# Patient Record
Sex: Female | Born: 1975 | Race: White | Hispanic: No | Marital: Single | Smoking: Current every day smoker
Health system: Southern US, Community
[De-identification: ages and names within clinical notes are randomized; demographics above are authoritative.]

## PROBLEM LIST (undated history)

## (undated) DIAGNOSIS — K219 Gastro-esophageal reflux disease without esophagitis: Secondary | ICD-10-CM

## (undated) DIAGNOSIS — Z9889 Other specified postprocedural states: Secondary | ICD-10-CM

## (undated) DIAGNOSIS — N83209 Unspecified ovarian cyst, unspecified side: Secondary | ICD-10-CM

## (undated) DIAGNOSIS — R112 Nausea with vomiting, unspecified: Secondary | ICD-10-CM

## (undated) HISTORY — PX: CHOLECYSTECTOMY: SHX55

## (undated) HISTORY — PX: KNEE SURGERY: SHX244

---

## 1996-09-02 HISTORY — PX: OVARY SURGERY: SHX727

## 1997-09-02 DIAGNOSIS — N83209 Unspecified ovarian cyst, unspecified side: Secondary | ICD-10-CM

## 1997-09-02 HISTORY — DX: Unspecified ovarian cyst, unspecified side: N83.209

## 2005-12-27 ENCOUNTER — Emergency Department: Payer: Self-pay | Admitting: Emergency Medicine

## 2007-05-29 ENCOUNTER — Emergency Department: Payer: Self-pay | Admitting: Emergency Medicine

## 2007-12-30 ENCOUNTER — Emergency Department: Payer: Self-pay | Admitting: Unknown Physician Specialty

## 2009-12-23 ENCOUNTER — Emergency Department: Payer: Self-pay | Admitting: Emergency Medicine

## 2010-09-02 HISTORY — PX: KNEE SURGERY: SHX244

## 2010-09-27 ENCOUNTER — Emergency Department: Payer: Self-pay | Admitting: Emergency Medicine

## 2010-12-14 ENCOUNTER — Ambulatory Visit: Payer: Self-pay | Admitting: Obstetrics and Gynecology

## 2010-12-18 ENCOUNTER — Inpatient Hospital Stay: Payer: Self-pay | Admitting: Obstetrics and Gynecology

## 2011-01-06 ENCOUNTER — Emergency Department: Payer: Self-pay | Admitting: Emergency Medicine

## 2011-05-27 ENCOUNTER — Emergency Department: Payer: Self-pay | Admitting: Emergency Medicine

## 2011-07-23 ENCOUNTER — Ambulatory Visit: Payer: Self-pay | Admitting: Orthopedic Surgery

## 2011-08-06 ENCOUNTER — Ambulatory Visit: Payer: Self-pay | Admitting: Orthopedic Surgery

## 2011-08-23 ENCOUNTER — Ambulatory Visit: Payer: Self-pay | Admitting: Orthopedic Surgery

## 2011-09-18 ENCOUNTER — Other Ambulatory Visit: Payer: Self-pay | Admitting: Orthopedic Surgery

## 2011-09-22 LAB — WOUND CULTURE

## 2011-09-27 ENCOUNTER — Encounter: Payer: Self-pay | Admitting: Cardiothoracic Surgery

## 2011-09-27 ENCOUNTER — Ambulatory Visit: Payer: Self-pay | Admitting: Orthopedic Surgery

## 2011-09-27 ENCOUNTER — Encounter: Payer: Self-pay | Admitting: Nurse Practitioner

## 2011-10-04 ENCOUNTER — Encounter: Payer: Self-pay | Admitting: Nurse Practitioner

## 2011-10-04 ENCOUNTER — Encounter: Payer: Self-pay | Admitting: Cardiothoracic Surgery

## 2011-10-24 LAB — WOUND CULTURE

## 2011-10-25 ENCOUNTER — Ambulatory Visit: Payer: Self-pay | Admitting: Orthopedic Surgery

## 2011-10-25 LAB — PROTIME-INR: Prothrombin Time: 12.2 secs (ref 11.5–14.7)

## 2011-10-25 LAB — BASIC METABOLIC PANEL
Anion Gap: 10 (ref 7–16)
Chloride: 105 mmol/L (ref 98–107)
Co2: 27 mmol/L (ref 21–32)
Creatinine: 0.78 mg/dL (ref 0.60–1.30)
EGFR (Non-African Amer.): >60
Osmolality: 281 (ref 275–301)
Potassium: 4.2 mmol/L (ref 3.5–5.1)

## 2011-10-25 LAB — CBC
HCT: 39.5 % (ref 35.0–47.0)
HGB: 12.7 g/dL (ref 12.0–16.0)
Platelet: 251 10*3/uL (ref 150–440)
RDW: 15.4 % — ABNORMAL HIGH (ref 11.5–14.5)

## 2011-10-25 LAB — APTT: Activated PTT: 26.2 secs (ref 23.6–35.9)

## 2011-10-28 ENCOUNTER — Ambulatory Visit: Payer: Self-pay | Admitting: Orthopedic Surgery

## 2011-11-01 ENCOUNTER — Encounter: Payer: Self-pay | Admitting: Cardiothoracic Surgery

## 2011-11-01 ENCOUNTER — Encounter: Payer: Self-pay | Admitting: Nurse Practitioner

## 2011-11-01 LAB — WOUND CULTURE

## 2011-12-02 ENCOUNTER — Encounter: Payer: Self-pay | Admitting: Cardiothoracic Surgery

## 2011-12-02 ENCOUNTER — Encounter: Payer: Self-pay | Admitting: Nurse Practitioner

## 2012-01-01 ENCOUNTER — Encounter: Payer: Self-pay | Admitting: Cardiothoracic Surgery

## 2012-01-01 ENCOUNTER — Encounter: Payer: Self-pay | Admitting: Nurse Practitioner

## 2012-01-16 ENCOUNTER — Emergency Department: Payer: Self-pay | Admitting: Emergency Medicine

## 2012-02-01 ENCOUNTER — Encounter: Payer: Self-pay | Admitting: Nurse Practitioner

## 2012-02-01 ENCOUNTER — Encounter: Payer: Self-pay | Admitting: Cardiothoracic Surgery

## 2012-03-02 ENCOUNTER — Encounter: Payer: Self-pay | Admitting: Nurse Practitioner

## 2012-03-02 ENCOUNTER — Encounter: Payer: Self-pay | Admitting: Cardiothoracic Surgery

## 2012-04-02 ENCOUNTER — Encounter: Payer: Self-pay | Admitting: Nurse Practitioner

## 2012-04-02 ENCOUNTER — Encounter: Payer: Self-pay | Admitting: Cardiothoracic Surgery

## 2012-09-06 ENCOUNTER — Emergency Department: Payer: Self-pay | Admitting: Emergency Medicine

## 2012-09-06 LAB — BASIC METABOLIC PANEL
Anion Gap: 7 (ref 7–16)
BUN: 10 mg/dL (ref 7–18)
Calcium, Total: 8.7 mg/dL (ref 8.5–10.1)
Creatinine: 0.86 mg/dL (ref 0.60–1.30)
Glucose: 92 mg/dL (ref 65–99)
Osmolality: 274 (ref 275–301)
Potassium: 4.2 mmol/L (ref 3.5–5.1)
Sodium: 138 mmol/L (ref 136–145)

## 2012-09-06 LAB — CBC
HCT: 41.4 % (ref 35.0–47.0)
HGB: 13.4 g/dL (ref 12.0–16.0)
MCH: 24.8 pg — ABNORMAL LOW (ref 26.0–34.0)
MCHC: 32.4 g/dL (ref 32.0–36.0)
Platelet: 320 10*3/uL (ref 150–440)
RBC: 5.41 10*6/uL — ABNORMAL HIGH (ref 3.80–5.20)
RDW: 18 % — ABNORMAL HIGH (ref 11.5–14.5)
WBC: 12.9 10*3/uL — ABNORMAL HIGH (ref 3.6–11.0)

## 2012-09-06 LAB — URINALYSIS, COMPLETE
Blood: NEGATIVE
Ketone: NEGATIVE
Nitrite: NEGATIVE
Protein: NEGATIVE
RBC,UR: 1 /HPF (ref 0–5)
Specific Gravity: 1.006 (ref 1.003–1.030)
Squamous Epithelial: 4
WBC UR: 2 /HPF (ref 0–5)

## 2012-10-30 IMAGING — CT CT TIBIA/FIBULA*L* WO/W CM
2 series · 10 of 14 positions shown, 12 images · non-contrast
Comparison: none

REASON FOR EXAM: pain and swelling  eval post op infection seroma  post
op knee surg
COMMENTS:

[Series 8: soft · axial · 0.55mm/px · z∈[-468,-192]mm · 5 of 138 slices shown, 7 images (1 of 2)]
[im 23/138  soft-tissue]
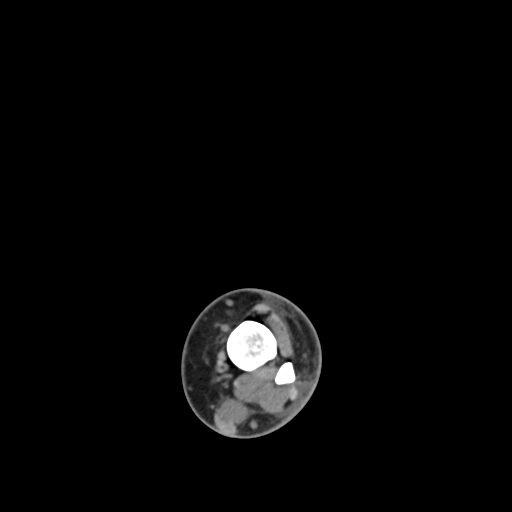
[im 23/138  bone]
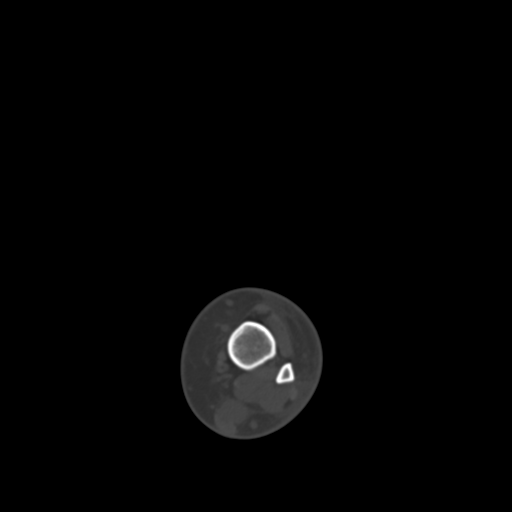
[im 46/138  bone]
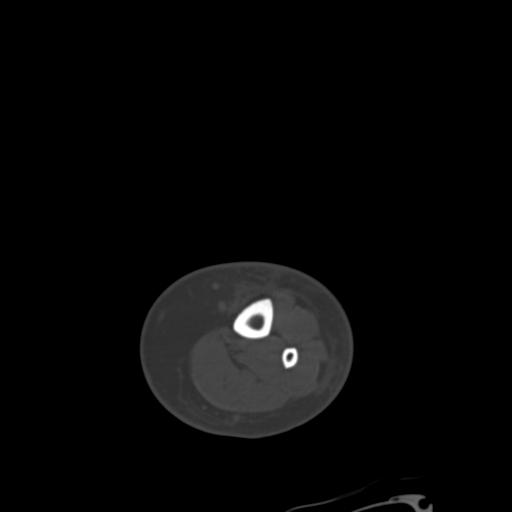
[im 69/138  bone]
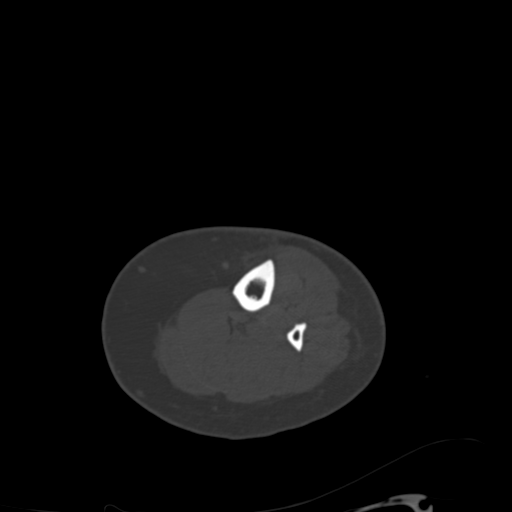
[im 92/138  bone]
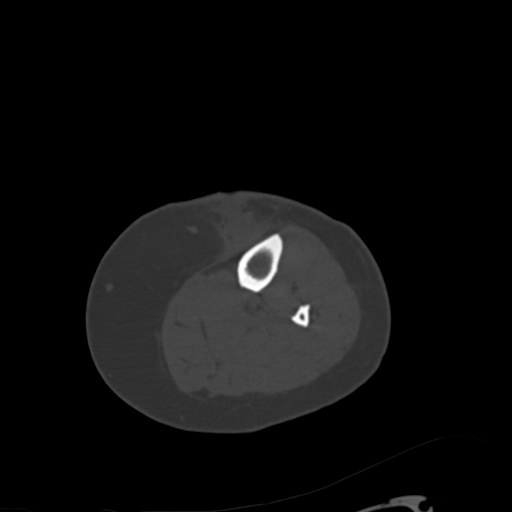
[im 115/138  soft-tissue]
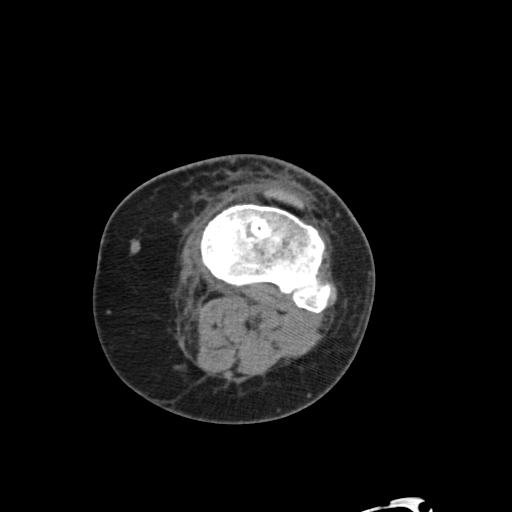
[im 115/138  bone]
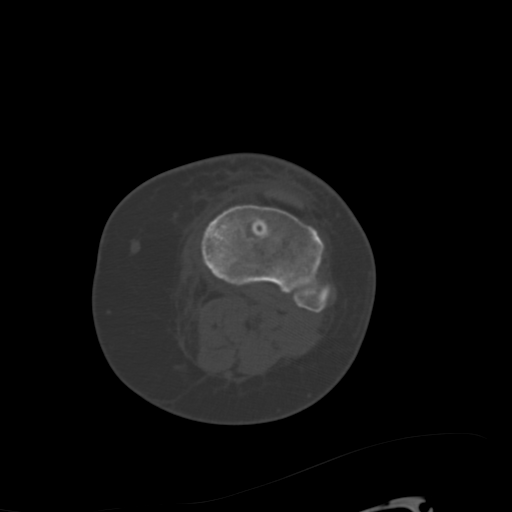

[Series 9: soft · axial · 0.55mm/px · z∈[-468,-192]mm · 5 of 138 slices shown (2 of 2)]
[im 23/138  soft-tissue]
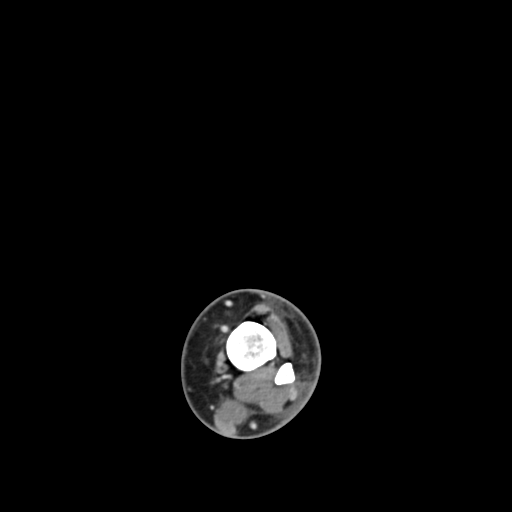
[im 46/138  soft-tissue]
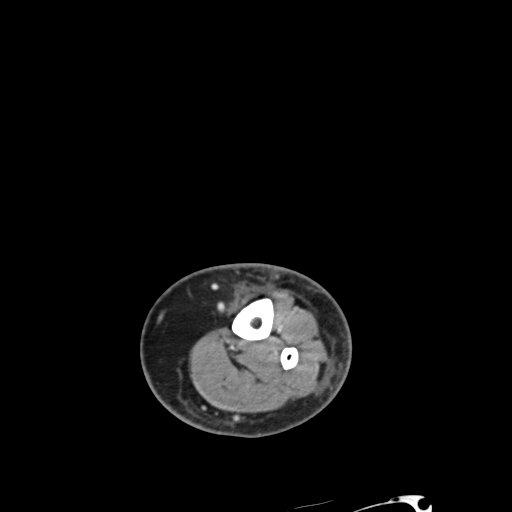
[im 69/138  soft-tissue]
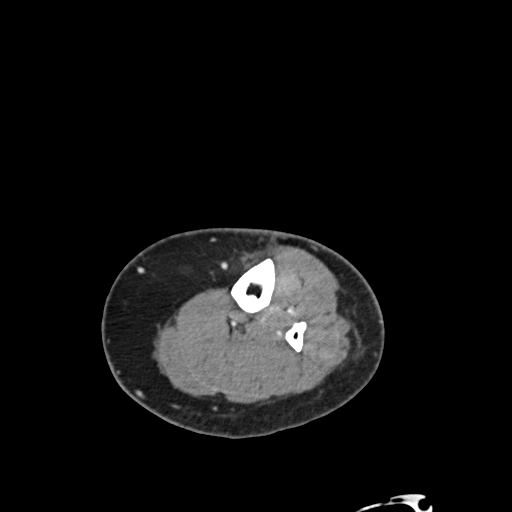
[im 92/138  soft-tissue]
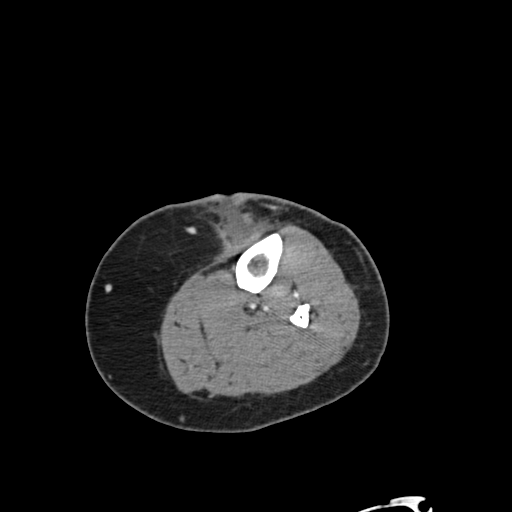
[im 115/138  soft-tissue]
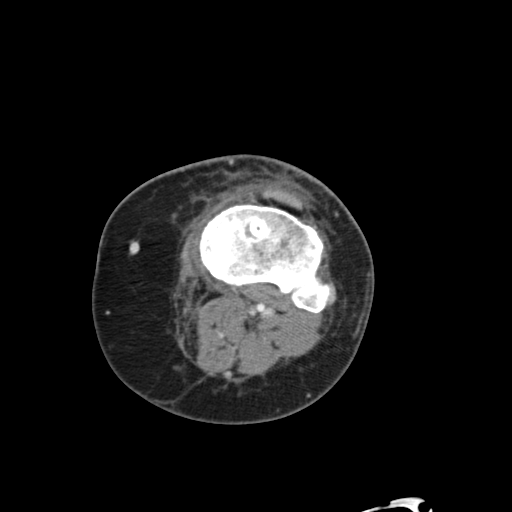

[10 of 14 positions shown; findings below may reference images not displayed]

PROCEDURE:     CT  - CT TIBIA / LOWER LEG LEFT W/WO  - September 27, 2011  [DATE]

RESULT:     Axial imaging was performed through the left leg from the knee
to the ankle. Coronal and sagittal reconstructions were obtained. Soft
tissue and bone windows were photographed. The patient received 100 cc of
Usovue-PJ3 for the postcontrast images.

Mineralization of the tibia and fibula is normal. There is no lytic or
blastic lesion or periosteal reaction.

There is a metallic staple from previous ACL repair present in the
metadiaphysis of the proximal tibia. The staple lies along the superior
aspect of an abnormal fluid collection in the pretibial soft tissues. This
fluid collection measures 5.6 cm in length by 2.3 cm AP by 1.9 cm
transversely. I see no rim calcification. There is minimal enhancement of
the soft tissues surrounding the fluid collection. The fluid collection
extends to the skin and its margins are irregular.
IMPRESSION: There is an abnormal fluid collection in the upper
pretibial soft tissues. This extends from the anterior surface of the
proximal tibia to the skin surface. This fluid collection may reflect a
seroma or abcess. Aspiration of the fluid collection laboratory analysis and
culture would be a useful next step. I do not see radiographic evidence of
acute osteomyelitis. Followup imaging techniques including white blood cell
scanning or 3 phase technetium bone scanning with SPECT could be considered.

## 2014-08-21 ENCOUNTER — Emergency Department: Payer: Self-pay | Admitting: Emergency Medicine

## 2014-08-23 LAB — BETA STREP CULTURE(ARMC)

## 2014-12-25 NOTE — Op Note (Signed)
PATIENT NAME:  Diamond Mcclain, Diamond Mcclain MR#:  [id number] DATE OF BIRTH:  May 01, 1976  DATE OF PROCEDURE:  08/06/2011  PREOPERATIVE DIAGNOSIS: Left knee anterior cruciate ligament tear and medial meniscal tear.   POSTOPERATIVE DIAGNOSIS: Left knee anterior cruciate ligament tear, displaced bucket handle tear of the medial meniscus, and radial tear of the lateral meniscus.   PROCEDURE: Left knee arthroscopy with partial medial and lateral meniscectomies and anterior cruciate ligament reconstruction with hamstring autograft.   SURGEON: [place], M.D.   ANESTHESIA: General.   COMPLICATIONS: None.   SPECIMENS: None.   INDICATIONS FOR PROCEDURE: The patient is a 39 year old female who injured her left knee after she stepped into a hole at work. She has had limited knee range of motion following this injury and episodes of instability. An MRI has revealed findings consistent with an anterior cruciate ligament tear and medial meniscal tear.   I reviewed the risks and benefits of surgery with the patient. She wished to proceed with surgery based on the MRI findings and her severely limited range of motion. She understands the risks of surgery include infection, bleeding, nerve or blood vessel injury, knee stiffness, persistent pain or instability, the need for an allograft hamstring, need for further surgery, the development of osteoarthritis, persistent pain or instability, deep venous thrombosis and pulmonary embolism, myocardial infarction, stroke, respiratory failure, pneumonia, and death. The patient understood these risks and signed the consent form in my office prior to the date of surgery. She had ample time to ask any questions regarding surgery, postoperative course, and consent.   PROCEDURE NOTE: The patient was met in the preoperative area. She had her history and physical updated. There were no changes to her health. The patient's left knee was signed with the word yes within the operative field  according to the hospital's right site protocol. The patient was then brought to the operating room where she was placed supine on the operative table. All bony prominences were adequately padded. She underwent general endotracheal intubation.   A time-out was performed to verify the patient's name, date of birth, medical record number, correct site of surgery, and correct procedure to be performed. It was also used to verify the patient had received antibiotics and that all appropriate instruments, implants, and radiographic studies were available in the room. Once all in attendance were in agreement, the case began.   Examination under anesthesia revealed that the patient could fully extend and flex to 120 degrees. She did have anterior laxity on Lachman test with anterior translation of 10 mm. She had a positive pivot shift test as well. There was no instability with varus or valgus stress testing at zero and 30 degrees of flexion.   The patient was prepped and draped in a sterile fashion. Proposed incisions were drawn out based upon bony landmarks with a surgical marker and pre-injected with 1% lidocaine plain. A #11 blade was used to establish inferior medial and lateral portals. The inferior medial portal was performed under direct visualization. The arthroscope was placed through the inferolateral portal and a full diagnostic examination of the knee was performed.    Findings on arthroscopy included a large bucket handle tear of the medial meniscus with displacement into the posterior knee, a torn anterior cruciate ligament from its femoral attachment as well as a tear to the mid body of the lateral meniscus which included a radial component as well as meniscal flap.   The meniscal tears were addressed first. The medial meniscal tear was  reduced into the knee joint with a nerve hook probe. There was maceration of the meniscal tear and it was within the white-white zone and deemed unrepairable. This  was removed from its attachment using an arthroscopic scissors and the bucket portion of the meniscus was removed from the knee in a single piece. The remaining meniscal rim was probed to ensure stability and a 4-0 resector shaver blade was used to resect any unstable portions of the remaining rim. The patient's anterior cruciate ligament was then resected using a 4-0 resector shaver blade and 90-degree ArthroCare wand. The patient's knee was then placed in a figure four position and the lateral meniscus tear was resected using a straight basket and 4-0 resector shaver blade. A stable remaining rim was ensured using a nerve hook probe. All arthroscopic instruments were then removed.   An anteromedial incision was made based over the proximal tibia. This incision was made with a #15 blade. All bleeding vessels were cauterized during exposure. The underlying soft tissue was incised using a 4-0 resector shaver blade. The hamstring tendons were then palpable. A #15 blade was used to make an L-shaped incision along the superior aspect of the hamstrings and then a vertical component was used to detach the hamstrings from their insertion point on the proximal tibia. Sartorius fascia was reflected to reveal the underlying gracilis and semitendinosus. These were removed from the knee using a tendon stripper. The two grafts were then prepared on the back table. The tendon lengths were cut at 220 mm in length. The aggregate of the four-stranded graft after preparation on the back table was 8 mm on the femoral side and 8.5 on the tibial side. The graft was whip-stitched using an ArthroCare fiber loop. The graft was then placed on the Graft Master under 15-mm of tension, and a moist Ray-Tec was placed over the four-stranded graft to prevent it from desiccation.   Attention was then turned back to the knee. The arthroscope was placed into the inferomedial portal. The femoral drill guide was then placed at the femoral  attachment site in the knee. A small lateral incision was made over the femur to allow advancement of the drill guide alongside the lateral femur. A small longitudinal incision was placed into the IT band to allow the drill guide to reach the lateral femoral cortex. This drill guide was then gently malleted into place to hold its position along the lateral cortex. An 8-mm flip cutter was then drilled across the femur and into the intercondylar notch. The interosseous distance was measured to be about 35 to 38 mm. The flip cutter was then engaged and approximately a 30-mm tunnel was created in a retrograde fashion. The flip cutter drill guide was then removed and an ArthroCare fiber stick suture was placed through the femoral tunnel and brought out the inferior lateral portal. Both ends of this suture were snapped to prevent the suture from falling out.   Attention was then given to creating the tibial tunnel.  A retro-cutting tunnel drill guide was placed at the footprint of the anterior cruciate ligament. The flip cutter drill guide was then advanced through the anterior cortex of the tibia obliquely until it reached the inside of the knee and engaged the retro-cutting drill bit.  This was brought out again through a retrograde fashion. The loop was then retrieved from inside the knee and brought out the tibial tunnel to allow for shuttling of the hamstring graft. An anterior cruciate ligament tightrope RT was  then attached to the four-stranded hamstring graft. This was shuttled through the knee using the fiber stick suture that had been placed previously. The four strands of the graft were brought out the lateral femoral incision. The button was then brought out the lateral cortex. Its position was confirmed on FluoroScan imaging to be flush with the lateral cortex. The four-stranded hamstring graft was then advanced by tensioning the strands of the suture of the anterior cruciate ligament tightrope RT. This was  bottomed out. The 30-mm mark placed on the anterior cruciate ligament graft was seen to be at the base of the femoral tunnel. The graft was then cycled to remove creep from the graft. The graft was then placed in 30 degrees of flexion. A Nitinol wire was placed through the tibial tunnel to allow guidance of the Bio Composite screw.  Counter traction was placed on the four-stranded hamstring graft while a 9 x 28 Arthrex Bio Composite screw was inserted. During insertion, a posterior drawer force was applied by the surgical tech.   Once the Bio Composite screw was in place, an 11-mm Richards staple was placed over the ends of the four-stranded hamstring graft as back-up fixation. The patient's range of motion was from full extension to 130 degrees of flexion on the table. Lachman test showed no anterior laxity. Final arthroscopic images of the graft were performed. With the knee in full extension there was no impingement on the intercondylar notch. There was excellent tension on the graft. A 4-0 resector shaver blade was used to irrigate the knee and remove any chondral or osseous debris. All arthroscopic instruments were then removed. 4-0 nylon sutures were used to close the arthroscopic portals times two. 2-0 Vicryl was used to close the subcutaneous tissues of the lateral femoral incision and the anterior medial proximal tibial incision. 4-0 nylons were used to approximate the skin of the femoral incision but a running 3-0 Monocryl was used to close the skin over the anterior medial proximal tibial incision. Dry sterile dressings were applied. A TENS unit was also applied to the left knee along with a Polar Care sleeve. A Breg T-scope brace was placed on the left knee in full extension. I was scrubbed and present for the entire case and all sharp and instrument counts were correct at the conclusion of the case.  I spoke with the patient's friend who accompanied her to surgery in the postoperative consultation  room to let her know the patient was stable in the recovery room and the case had gone without complication.     ____________________________ Timoteo Gaul, MD klk:bjt D: 08/13/2011 12:27:01 ET T: 08/13/2011 13:08:40 ET JOB#: [id number]  cc: Timoteo Gaul, MD, <Dictator> Timoteo Gaul MD ELECTRONICALLY SIGNED 09/09/2011 13:35

## 2014-12-25 NOTE — Discharge Summary (Signed)
PATIENT NAME:  Diamond Mcclain, Diamond Mcclain MR#:  [id number] DATE OF BIRTH:  Jul 19, 1976  DATE OF ADMISSION:  10/28/2011 DATE OF DISCHARGE:  10/31/2011  ADMITTING DIAGNOSIS: Left tibia wound irrigation and debridement.   HISTORY OF PRESENT ILLNESS: Diamond Mcclain is a 39 year old female who had undergone a left anterior cruciate ligament reconstruction on 08/06/2011. A month postop the patient developed persistent drainage from the inferior pole of the incision. She was then admitted postoperatively after her second irrigation and debridement for this persistently draining wound.   HOSPITAL COURSE: The patient was admitted on 10/28/2011 postoperatively and brought to the orthopedic floor. A Wound VAC was ordered for the patient which was delivered on 10/29/2011. This was applied on the floor. The VAC dressing had been placed intraoperatively. The home unit was delivered on postop day #2. The patient continued to have significant left leg pain through the second postoperative day. She received physical therapy while inpatient to work on lower extremity exercises and assistance with ambulation with the Wound VAC in place. The patient also was on Unasyn while an inpatient to treat Enterococcus which had been seen on previous cultures. The cultures throughout her hospitalization taken from the operating room were negative for bacterial growth. Given the patient's clinical improvement, she was prepared for discharge on postop day #3. She was instructed to continue to elevate her left lower extremity whenever possible. She will have home health for Wound VAC dressing changes. She will also continue working on home exercise program and will follow-up in my office in 7 to 10 days for re-evaluation.   ____________________________ Kathreen DevoidKevin L. Dayton Kenley, MD klk:drc D: 11/19/2011 11:12:11 ET T: 11/19/2011 11:21:48 ET JOB#: [id number]  cc: Kathreen DevoidKevin L. Tyvon Eggenberger, MD, <Dictator> Kathreen DevoidKEVIN L Annabelle Rexroad MD ELECTRONICALLY SIGNED 11/22/2011 9:19

## 2014-12-25 NOTE — Op Note (Signed)
PATIENT NAME:  Diamond Mcclain, Diamond Mcclain MR#:  [id number] DATE OF BIRTH:  02/19/1976  DATE OF PROCEDURE:  10/28/2011  PREOPERATIVE DIAGNOSIS: Left leg persistent wound drainage.   POSTOPERATIVE DIAGNOSIS: Left leg persistent wound drainage.  PROCEDURE: Open irrigation and debridement of left proximal tibial wound.   SURGEON: Juanell FairlyKevin Shaletha Humble, MD   ANESTHESIA: General.   COMPLICATIONS: None.   SPECIMENS: Two culture swabs to microbiology as well as tissue to microbiology for assessment for infection.   INDICATION FOR THE PROCEDURE: Ms. Diamond Mcclain is a 39 year old female who underwent a left knee anterior cruciate ligament reconstruction on 08/06/2011 for an injury at work. She has had persistent drainage from anterior tibial incision postoperatively. She underwent one previous irrigation and debridement but had persistent drainage despite treatment in the Wound Center. Given its failure to close and persistent drainage, I felt it was best to repeat the irrigation and debridement. There has been no clinical indication of a septic knee joint on exam. The patient is  showing no signs of sepsis.   PROCEDURE NOTE: The patient signed the consent form. She understands the risks of the procedure included infection, persistent drainage, and the need for further surgery as well as medical complications such as deep venous thrombosis and pulmonary embolism, myocardial infarction, stroke, pneumonia, respiratory failure, and death. The patient was seen in the preop area where her left leg was marked with the word yes according to hospital's right site protocol. Her History and Physical was updated. The patient had ample time to ask questions, as did her friend at the bedside. The patient was then taken to the Operating Room where she was placed supine on the operative table. All bony prominences were adequately padded. Antibiotics were held as cultures were to be taken. The patient was prepped and draped in sterile fashion. A  timeout was performed to verify the patient's name, date of birth, medical record number, correct site of surgery, and correct procedure to be performed. We also verified the patient received antibiotics and that all appropriate instruments, implants, and radiographic studies were available in the room. Once all in attendance were in agreement, the case began.   The patient had an initial culture swab taken of the open portion of the inferior incision. This was sent to the microbiology lab for aerobic and anaerobic culture. A #15 blade was then used to extend the open wound proximally and distally for a total length of approximately 3 to 4 cm. The skin edges and edges of the tissue were carefully resected and excised leading to a new healthy layer of tissue. The excised tissue was sent to the laboratory for evaluation for infection. A deep culture swab was also taken and sent to the laboratory for evaluation. The patient had an area of opening over the medial proximal tibia following into the hiatus for the medial hamstring tendons which were harvested for her original anterior cruciate ligament site. No purulent material was encountered during this procedure. There was no large focal seromas encountered either. The patient had 6 liters of GU-impregnated saline pulse lavage throughout the wounds. The proximal portion of the incision was then closed with 3-0 nylon in interrupted fashion. The inferior portion of the wound measuring approximately 1.5 x 2 cm, which was the original size of the opening wound, was then packed with a VAC dressing sponge. A VAC dressing was then applied to the left leg. The patient was then awoken and brought to the PAC-U in stable condition. I was scrubbed and  present for the entire case, and all sharp and instrument counts were correct at the conclusion of the case.   By the end of the case, the stat Gram stains were back. The superficial swab showed no organisms and moderate white  blood cells. The deep swab showed no organisms and a few white blood cells, and the tissue culture showed no organisms with medium white blood cells. I will continue following these cultures. The patient is being admitted for intravenous antibiotics. The last culture sent from the Wound Center had grown out Enterococcus faecalis sensitive to ampicillin. She is being placed on Unasyn 3 grams every 6 hours per the Pharmacy recommendations. The patient will also have a portable VAC unit ordered for her to take home. She may be discharged home with Home Health after this [place] unit is available to her.  ____________________________ Diamond Devoid, MD klk:cbb D: 10/29/2011 12:21:57 ET T: 10/29/2011 12:39:06 ET JOB#: [id number]  cc: Diamond Devoid, MD, <Dictator> Diamond Devoid MD ELECTRONICALLY SIGNED 10/30/2011 17:27

## 2014-12-25 NOTE — Op Note (Signed)
PATIENT NAME:  Diamond Mcclain, Diamond Mcclain MR#:  [id number] DATE OF BIRTH:  14-Dec-1975  DATE OF PROCEDURE:  08/23/2011  PREOPERATIVE DIAGNOSIS: Left tibial incision with possible wound infection.   POSTOPERATIVE DIAGNOSIS: Persistent drainage left tibial incision, likely hematoma versus superficial wound infection.   PROCEDURE: Irrigation and debridement of left knee tibial incision.   SURGEON: Kathreen DevoidKevin L. Makinsey Pepitone, MD  ANESTHESIA: General.   SPECIMENS: Swabs sent for Gram stain and culture to microbiology.   FINDINGS. No purulent fluid was encountered, serosanguineous fluid only was seen.   ESTIMATED BLOOD LOSS: Minimal.   COMPLICATIONS: None.   INDICATIONS FOR PROCEDURE: Patient is a 39 year old female who underwent a left knee anterior cruciate ligament reconstruction on 08/06/2011. She returned to the office today for her second postoperative visit and had persistent serosanguineous drainage from the inferior pole of the anterior tibial incision despite being on Keflex 500 mg t.i.d. for seven days. The decision was made to wash out the incision for possible wound infection. Patient understood and agreed with this plan. She understands the risks of the procedure include infection, bleeding, bruising, persistent pain or persistent drainage and the need for further surgery. Medical complications will include deep venous thrombosis and pulmonary embolism, myocardial infarction, stroke, pneumonia, respiratory failure, and death. Patient signed the consent form prior to surgery. I marked the word "yes" over the left knee within the operative field according to the hospital's right site protocol.   PROCEDURE NOTE: Patient was taken to the Operating Room where she was placed supine on the operative table and underwent general anesthesia. She was prepped and draped in sterile fashion. A timeout was performed to verify the patient's name, date of birth, medical record number, correct site of surgery, and correct  procedure to be performed. Patient received 1 gram of vancomycin prior to the onset of the case.   A 15 blade was used to open the inferior three-quarters of the original incision. No purulent fluid was encountered. The original suture material was removed as it was encountered. Serous fluid was the only fluid encountered during the case. Patient was pulse irrigated with 6 liters of GU impregnated saline. The wound was then dried. The subcutaneous tissues were closed in two layers using 2-0 PDS. The skin was approximated with a running 3-0 Monocryl and reinforced with 4-0 nylon. Dry sterile and compressive dressing was applied to the left leg. She was then awoken, brought to the PAC-U in stable condition. I was scrubbed and present for the entire case and all sharp and instrument counts were correct at the conclusion of the case. Patient will remain on Keflex 500 mg every six hours. I spoke with Dr. Leavy CellaBlocker, infectious disease specialist, who agreed with treating the patient with Keflex. I will follow the culture and Gram stains.   ____________________________ Kathreen DevoidKevin L. Cashius Grandstaff, MD klk:cms D: 08/23/2011 17:29:20 ET T: 08/23/2011 17:47:59 ET JOB#: [id number]  cc: Kathreen DevoidKevin L. Petrona Wyeth, MD, <Dictator> Kathreen DevoidKEVIN L Graig Hessling MD ELECTRONICALLY SIGNED 09/06/2011 15:15

## 2015-02-04 ENCOUNTER — Encounter: Payer: Self-pay | Admitting: Emergency Medicine

## 2015-02-04 ENCOUNTER — Emergency Department
Admission: EM | Admit: 2015-02-04 | Discharge: 2015-02-04 | Disposition: A | Discharge status: Home / Self Care | Payer: Self-pay | Attending: Emergency Medicine | Admitting: Emergency Medicine

## 2015-02-04 ENCOUNTER — Emergency Department: Payer: Self-pay

## 2015-02-04 DIAGNOSIS — Y9389 Activity, other specified: Secondary | ICD-10-CM | POA: Insufficient documentation

## 2015-02-04 DIAGNOSIS — S40012A Contusion of left shoulder, initial encounter: Secondary | ICD-10-CM | POA: Insufficient documentation

## 2015-02-04 DIAGNOSIS — Y9289 Other specified places as the place of occurrence of the external cause: Secondary | ICD-10-CM | POA: Insufficient documentation

## 2015-02-04 DIAGNOSIS — Y998 Other external cause status: Secondary | ICD-10-CM | POA: Insufficient documentation

## 2015-02-04 DIAGNOSIS — Z720 Tobacco use: Secondary | ICD-10-CM | POA: Insufficient documentation

## 2015-02-04 DIAGNOSIS — W010XXA Fall on same level from slipping, tripping and stumbling without subsequent striking against object, initial encounter: Secondary | ICD-10-CM | POA: Insufficient documentation

## 2015-02-04 MED ORDER — IBUPROFEN 800 MG PO TABS: 800.0000 mg | ORAL_TABLET | Freq: Three times a day (TID) | ORAL | Status: DC | PRN

## 2015-02-04 MED ORDER — CYCLOBENZAPRINE HCL 5 MG PO TABS: 5.0000 mg | ORAL_TABLET | Freq: Three times a day (TID) | ORAL | Status: DC | PRN

## 2015-02-04 NOTE — ED Provider Notes (Signed)
[facility] Emergency Department Provider Note  ____________________________________________  Time seen: Approximately 7:17 AM  I have reviewed the triage vital signs and the nursing notes.   HISTORY  Chief Complaint Shoulder Pain    HPI EADIE REPETTO is a 39 y.o. female presents to the emergency room for evaluation of her left shoulder pain. Patient states that she was at [facility] this morning when she slipped on a pole water landing on her front arms. Complained of increasing and progressively worsening pain to the left shoulder increased pain with extension, lifting and abduction.Neisseria denies any loss of consciousness no other physical complaints other than some bruising noted to both lower legs.   History reviewed. No pertinent past medical history.  There are no active problems to display for this patient.   Past Surgical History  Procedure Laterality Date  . Knee surgery      Current Outpatient Rx  Name  Route  Sig  Dispense  Refill  . cyclobenzaprine (FLEXERIL) 5 MG tablet   Oral   Take 1 tablet (5 mg total) by mouth every 8 (eight) hours as needed for muscle spasms.   30 tablet   0   . ibuprofen (ADVIL,MOTRIN) 800 MG tablet   Oral   Take 1 tablet (800 mg total) by mouth every 8 (eight) hours as needed.   30 tablet   0     Allergies Sulfa antibiotics and Tape  No family history on file.  Social History History  Substance Use Topics  . Smoking status: Current Every Day Smoker -- 0.50 packs/day    Types: Cigarettes  . Smokeless tobacco: Not on file  . Alcohol Use: No    Review of Systems Constitutional: No fever/chills Eyes: No visual changes. ENT: No sore throat. Cardiovascular: Denies chest pain. Respiratory: Denies shortness of breath. Gastrointestinal: No abdominal pain.  No nausea, no vomiting.  No diarrhea.  No constipation. Genitourinary: Negative for dysuria. Musculoskeletal: Negative for back pain. Positive for  left upper shoulder pain. Skin: Negative for rash. Neurological: Negative for headaches, focal weakness or numbness.  10-point ROS otherwise negative.  ____________________________________________   PHYSICAL EXAM:  VITAL SIGNS: ED Triage Vitals  Enc Vitals Group     BP --      Pulse --      Resp --      Temp --      Temp src --      SpO2 --      Weight --      Height --      Head Cir --      Peak Flow --      Pain Score --      Pain Loc --      Pain Edu? --      Excl. in GC? --     Constitutional: Alert and oriented. Well appearing and in no acute distress. Eyes: Conjunctivae are normal. PERRL. EOMI. Head: Atraumatic. Nose: No congestion/rhinnorhea. Mouth/Throat: Mucous membranes are moist.  Oropharynx non-erythematous. Neck: No stridor.   Cardiovascular: Normal rate, regular rhythm. Grossly normal heart sounds.  Good peripheral circulation. Respiratory: Normal respiratory effort.  No retractions. Lungs CTAB. Musculoskeletal: Upper left shoulder with tenderness in abduction and adduction. No evidence of ecchymosis or bruising. Increased pain with lifting overhead. Severe limited range of motion Neurologic:  Normal speech and language. No gross focal neurologic deficits are appreciated. Speech is normal. No gait instability. Skin:  Skin is warm, dry and intact. No rash  noted. Psychiatric: Mood and affect are normal. Speech and behavior are normal.  ____________________________________________   LABS (all labs ordered are listed, but only abnormal results are displayed)  Labs Reviewed - No data to display ____________________________________________  EKG  Deferred ____________________________________________  RADIOLOGY  Read by radiologist, reviewed by myself. Negative ____________________________________________   PROCEDURES  Procedure(s) performed: None  Critical Care performed: No  ____________________________________________   INITIAL  IMPRESSION / ASSESSMENT AND PLAN / ED COURSE  Pertinent labs & imaging results that were available during my care of the patient were reviewed by me and considered in my medical decision making (see chart for details).  She is status post fall this morning landing on her left shoulder. Diagnosis is a left shoulder contusion. Rx given for Motrin and Flexeril. Reassurance provided patient to wear sling as needed for comfort. Return to the ER if symptoms worsen. Patient voices no other emergency medical complaints at this time ____________________________________________   FINAL CLINICAL IMPRESSION(S) / ED DIAGNOSES  Final diagnoses:  Shoulder contusion, left, initial encounter      Evangeline Dakinharles M Glennda Weatherholtz, PA-C 02/04/15 [id number]  Governor Rooksebecca Lord, MD 02/04/15 [phone]

## 2015-02-04 NOTE — Discharge Instructions (Signed)
Contusion °A contusion is a deep bruise. Contusions are the result of an injury that caused bleeding under the skin. The contusion may turn blue, purple, or yellow. Minor injuries will give you a painless contusion, but more severe contusions may stay painful and swollen for a few weeks.  °CAUSES  °A contusion is usually caused by a blow, trauma, or direct force to an area of the body. °SYMPTOMS  °· Swelling and redness of the injured area. °· Bruising of the injured area. °· Tenderness and soreness of the injured area. °· Pain. °DIAGNOSIS  °The diagnosis can be made by taking a history and physical exam. An X-ray, CT scan, or MRI may be needed to determine if there were any associated injuries, such as fractures. °TREATMENT  °Specific treatment will depend on what area of the body was injured. In general, the best treatment for a contusion is resting, icing, elevating, and applying cold compresses to the injured area. Over-the-counter medicines may also be recommended for pain control. Ask your caregiver what the best treatment is for your contusion. °HOME CARE INSTRUCTIONS  °· Put ice on the injured area. °¨ Put ice in a plastic bag. °¨ Place a towel between your skin and the bag. °¨ Leave the ice on for 15-20 minutes, 3-4 times a day, or as directed by your health care provider. °· Only take over-the-counter or prescription medicines for pain, discomfort, or fever as directed by your caregiver. Your caregiver may recommend avoiding anti-inflammatory medicines (aspirin, ibuprofen, and naproxen) for 48 hours because these medicines may increase bruising. °· Rest the injured area. °· If possible, elevate the injured area to reduce swelling. °SEEK IMMEDIATE MEDICAL CARE IF:  °· You have increased bruising or swelling. °· You have pain that is getting worse. °· Your swelling or pain is not relieved with medicines. °MAKE SURE YOU:  °· Understand these instructions. °· Will watch your condition. °· Will get help right  away if you are not doing well or get worse. °Document Released: 05/29/2005 Document Revised: 08/24/2013 Document Reviewed: 06/24/2011 °Exit[employer]® Patient Information ©2015 [employer]. This information is not intended to replace advice given to you by your health care provider. Make sure you discuss any questions you have with your health care provider. ° °

## 2015-04-06 ENCOUNTER — Other Ambulatory Visit: Payer: Self-pay | Admitting: Orthopedic Surgery

## 2015-04-06 DIAGNOSIS — M25512 Pain in left shoulder: Secondary | ICD-10-CM

## 2015-04-12 ENCOUNTER — Ambulatory Visit
Admission: RE | Admit: 2015-04-12 | Discharge: 2015-04-12 | Disposition: A | Discharge status: Home / Self Care | Payer: Self-pay | Source: Ambulatory Visit | Attending: Orthopedic Surgery | Admitting: Orthopedic Surgery

## 2015-04-12 DIAGNOSIS — M19012 Primary osteoarthritis, left shoulder: Secondary | ICD-10-CM | POA: Insufficient documentation

## 2015-04-12 DIAGNOSIS — S46812A Strain of other muscles, fascia and tendons at shoulder and upper arm level, left arm, initial encounter: Secondary | ICD-10-CM | POA: Insufficient documentation

## 2015-04-12 DIAGNOSIS — M75102 Unspecified rotator cuff tear or rupture of left shoulder, not specified as traumatic: Secondary | ICD-10-CM | POA: Insufficient documentation

## 2015-04-12 DIAGNOSIS — M25512 Pain in left shoulder: Secondary | ICD-10-CM

## 2015-04-12 MED ORDER — IOHEXOL 300 MG/ML  SOLN: 50.0000 mL | Freq: Once | INTRAMUSCULAR | Status: DC | PRN

## 2015-04-12 MED ORDER — GADOBENATE DIMEGLUMINE 529 MG/ML IV SOLN
5.0000 mL | Freq: Once | INTRAVENOUS | Status: AC | PRN
  Administered 2015-04-12: 1 mL via INTRAVENOUS

## 2015-07-04 ENCOUNTER — Encounter: Payer: Self-pay | Admitting: *Deleted

## 2015-07-04 ENCOUNTER — Other Ambulatory Visit: Payer: Self-pay

## 2015-07-04 NOTE — Patient Instructions (Signed)
  Your procedure is scheduled on: 07-12-15 [facility](WEDNESDAY) Report to [facility] 2ND FLOOR To find out your arrival time please call [phone] between 1PM - 3PM on 07-11-15 (TUESDAY)  Remember: Instructions that are not followed completely may result in serious medical risk, up to and including death, or upon the discretion of your surgeon and anesthesiologist your surgery may need to be rescheduled.    _X___ 1. Do not eat food or drink liquids after midnight. No gum chewing or hard candies.     _X___ 2. No Alcohol for 24 hours before or after surgery.   ____ 3. Bring all medications with you on the day of surgery if instructed.    _X___ 4. Notify your doctor if there is any change in your medical condition     (cold, fever, infections).     Do not wear jewelry, make-up, hairpins, clips or nail polish.  Do not wear lotions, powders, or perfumes. You may wear deodorant.  Do not shave 48 hours prior to surgery. Men may shave face and neck.  Do not bring valuables to the hospital.    [facility] is not responsible for any belongings or valuables.               Contacts, dentures or bridgework may not be worn into surgery.  Leave your suitcase in the car. After surgery it may be brought to your room.  For patients admitted to the hospital, discharge time is determined by your   treatment team.   Patients discharged the day of surgery will not be allowed to drive home.   Please read over the following fact sheets that you were given:     ____ Take these medicines the morning of surgery with A SIP OF WATER:    1. NONE  2.   3.   4.  5.  6.  ____ Fleet Enema (as directed)   ____ Use CHG Soap as directed  ____ Use inhalers on the day of surgery  ____ Stop metformin 2 days prior to surgery    ____ Take 1/2 of usual insulin dose the night before surgery and none on the morning of surgery.   ____ Stop Coumadin/Plavix/aspirin-N/A  _X___ Stop  Anti-inflammatories-STOP IBUPROFEN NOW-NO NSAIDS OR ASA PRODUCTS-TYLENOL OK   ____ Stop supplements until after surgery.    ____ Bring C-Pap to the hospital.

## 2015-07-05 ENCOUNTER — Encounter
Admission: RE | Admit: 2015-07-05 | Discharge: 2015-07-05 | Disposition: A | Discharge status: Home / Self Care | Payer: Self-pay | Source: Ambulatory Visit | Attending: Orthopedic Surgery | Admitting: Orthopedic Surgery

## 2015-07-05 DIAGNOSIS — Z01818 Encounter for other preprocedural examination: Secondary | ICD-10-CM | POA: Insufficient documentation

## 2015-07-05 LAB — BASIC METABOLIC PANEL
ANION GAP: 8 (ref 5–15)
BUN: 11 mg/dL (ref 6–20)
CHLORIDE: 105 mmol/L (ref 101–111)
CO2: 25 mmol/L (ref 22–32)
CREATININE: 0.76 mg/dL (ref 0.44–1.00)
Calcium: 8.8 mg/dL — ABNORMAL LOW (ref 8.9–10.3)
GFR calc non Af Amer: >60 mL/min (ref >60)
GLUCOSE: 77 mg/dL (ref 65–99)
Potassium: 3.9 mmol/L (ref 3.5–5.1)
Sodium: 138 mmol/L (ref 135–145)

## 2015-07-05 LAB — URINALYSIS COMPLETE WITH MICROSCOPIC (ARMC ONLY)
BACTERIA UA: NONE SEEN
Bilirubin Urine: NEGATIVE
GLUCOSE, UA: NEGATIVE mg/dL
Ketones, ur: NEGATIVE mg/dL
NITRITE: NEGATIVE
Protein, ur: NEGATIVE mg/dL
Specific Gravity, Urine: 1.014 (ref 1.005–1.030)
pH: 5 (ref 5.0–8.0)

## 2015-07-05 LAB — PROTIME-INR
INR: 0.93
PROTHROMBIN TIME: 12.7 s (ref 11.4–15.0)

## 2015-07-05 LAB — CBC
HCT: 39.5 % (ref 35.0–47.0)
HEMOGLOBIN: 12.6 g/dL (ref 12.0–16.0)
MCH: 25.2 pg — AB (ref 26.0–34.0)
MCHC: 31.8 g/dL — ABNORMAL LOW (ref 32.0–36.0)
MCV: 79.4 fL — AB (ref 80.0–100.0)
Platelets: 315 10*3/uL (ref 150–440)
RBC: 4.98 MIL/uL (ref 3.80–5.20)
RDW: 15.5 % — ABNORMAL HIGH (ref 11.5–14.5)
WBC: 11.4 10*3/uL — ABNORMAL HIGH (ref 3.6–11.0)

## 2015-07-05 LAB — APTT: aPTT: 26 seconds (ref 24–36)

## 2015-07-19 ENCOUNTER — Encounter
Admission: RE | Disposition: A | Discharge status: Home / Self Care | Payer: Self-pay | Source: Ambulatory Visit | Attending: Orthopedic Surgery

## 2015-07-19 ENCOUNTER — Ambulatory Visit: Payer: Self-pay | Admitting: Certified Registered Nurse Anesthetist

## 2015-07-19 ENCOUNTER — Encounter: Payer: Self-pay | Admitting: *Deleted

## 2015-07-19 ENCOUNTER — Observation Stay
Admission: RE | Admit: 2015-07-19 | Discharge: 2015-07-20 | Disposition: A | Discharge status: Home / Self Care | Payer: Self-pay | Source: Ambulatory Visit | Attending: Orthopedic Surgery | Admitting: Orthopedic Surgery

## 2015-07-19 DIAGNOSIS — D649 Anemia, unspecified: Secondary | ICD-10-CM | POA: Insufficient documentation

## 2015-07-19 DIAGNOSIS — K219 Gastro-esophageal reflux disease without esophagitis: Secondary | ICD-10-CM | POA: Insufficient documentation

## 2015-07-19 DIAGNOSIS — M75112 Incomplete rotator cuff tear or rupture of left shoulder, not specified as traumatic: Principal | ICD-10-CM | POA: Insufficient documentation

## 2015-07-19 DIAGNOSIS — R0602 Shortness of breath: Secondary | ICD-10-CM | POA: Insufficient documentation

## 2015-07-19 DIAGNOSIS — Z6841 Body Mass Index (BMI) 40.0 and over, adult: Secondary | ICD-10-CM | POA: Insufficient documentation

## 2015-07-19 DIAGNOSIS — R112 Nausea with vomiting, unspecified: Secondary | ICD-10-CM | POA: Insufficient documentation

## 2015-07-19 DIAGNOSIS — F17210 Nicotine dependence, cigarettes, uncomplicated: Secondary | ICD-10-CM | POA: Insufficient documentation

## 2015-07-19 DIAGNOSIS — Z9889 Other specified postprocedural states: Secondary | ICD-10-CM

## 2015-07-19 HISTORY — DX: Gastro-esophageal reflux disease without esophagitis: K21.9

## 2015-07-19 HISTORY — PX: SHOULDER ARTHROSCOPY WITH OPEN ROTATOR CUFF REPAIR: SHX6092

## 2015-07-19 LAB — POCT PREGNANCY, URINE: PREG TEST UR: NEGATIVE

## 2015-07-19 SURGERY — ARTHROSCOPY, SHOULDER WITH REPAIR, ROTATOR CUFF, OPEN
Anesthesia: General | Laterality: Left | Wound class: Clean

## 2015-07-19 MED ORDER — ACETAMINOPHEN 650 MG RE SUPP: 650.0000 mg | Freq: Four times a day (QID) | RECTAL | Status: DC | PRN

## 2015-07-19 MED ORDER — OXYCODONE HCL 5 MG PO TABS
5.0000 mg | ORAL_TABLET | ORAL | Status: DC | PRN
Start: 2015-07-19 — End: 2015-07-20
  Administered 2015-07-19 (×2): 5 mg via ORAL
  Administered 2015-07-20 (×4): 10 mg via ORAL
  Filled 2015-07-19 (×2): qty 1
  Filled 2015-07-19 (×4): qty 2

## 2015-07-19 MED ORDER — FENTANYL CITRATE (PF) 100 MCG/2ML IJ SOLN
INTRAMUSCULAR | Status: DC | PRN
  Administered 2015-07-19 (×4): 50 ug via INTRAVENOUS

## 2015-07-19 MED ORDER — ROCURONIUM BROMIDE 100 MG/10ML IV SOLN
INTRAVENOUS | Status: DC | PRN
  Administered 2015-07-19 (×2): 20 mg via INTRAVENOUS
  Administered 2015-07-19: 30 mg via INTRAVENOUS

## 2015-07-19 MED ORDER — POTASSIUM CHLORIDE IN NACL 20-0.45 MEQ/L-% IV SOLN
INTRAVENOUS | Status: DC
  Administered 2015-07-20: 05:00:00 via INTRAVENOUS
  Filled 2015-07-19 (×3): qty 1000

## 2015-07-19 MED ORDER — DOCUSATE SODIUM 100 MG PO CAPS: 100.0000 mg | ORAL_CAPSULE | Freq: Two times a day (BID) | ORAL | Status: DC

## 2015-07-19 MED ORDER — CEFAZOLIN SODIUM-DEXTROSE 2-3 GM-% IV SOLR
2.0000 g | Freq: Four times a day (QID) | INTRAVENOUS | Status: AC
  Administered 2015-07-20 (×2): 2 g via INTRAVENOUS
  Filled 2015-07-19 (×2): qty 50

## 2015-07-19 MED ORDER — NEOMYCIN-POLYMYXIN B GU 40-200000 IR SOLN
Status: DC | PRN
  Administered 2015-07-19: 2 mL

## 2015-07-19 MED ORDER — MENTHOL 3 MG MT LOZG
1.0000 | LOZENGE | OROMUCOSAL | Status: DC | PRN
  Filled 2015-07-19: qty 9

## 2015-07-19 MED ORDER — HYDROMORPHONE HCL 1 MG/ML IJ SOLN
INTRAMUSCULAR | Status: AC
  Administered 2015-07-19: 0.5 mg via INTRAVENOUS
  Filled 2015-07-19: qty 1

## 2015-07-19 MED ORDER — ACETAMINOPHEN 10 MG/ML IV SOLN
INTRAVENOUS | Status: AC
  Filled 2015-07-19: qty 100

## 2015-07-19 MED ORDER — ONDANSETRON HCL 4 MG/2ML IJ SOLN
4.0000 mg | Freq: Four times a day (QID) | INTRAMUSCULAR | Status: DC | PRN
  Administered 2015-07-19 – 2015-07-20 (×2): 4 mg via INTRAVENOUS
  Filled 2015-07-19 (×2): qty 2

## 2015-07-19 MED ORDER — HYDROMORPHONE HCL 1 MG/ML IJ SOLN
1.0000 mg | INTRAMUSCULAR | Status: DC | PRN
  Administered 2015-07-20: 1 mg via INTRAVENOUS
  Filled 2015-07-19: qty 1

## 2015-07-19 MED ORDER — FENTANYL CITRATE (PF) 100 MCG/2ML IJ SOLN
25.0000 ug | INTRAMUSCULAR | Status: AC | PRN
  Administered 2015-07-19 (×2): 25 ug via INTRAVENOUS

## 2015-07-19 MED ORDER — SUCCINYLCHOLINE CHLORIDE 20 MG/ML IJ SOLN
INTRAMUSCULAR | Status: DC | PRN
  Administered 2015-07-19: 100 mg via INTRAVENOUS

## 2015-07-19 MED ORDER — ONDANSETRON HCL 4 MG/2ML IJ SOLN: 4.0000 mg | Freq: Once | INTRAMUSCULAR | Status: DC | PRN

## 2015-07-19 MED ORDER — CYCLOBENZAPRINE HCL 10 MG PO TABS: 5.0000 mg | ORAL_TABLET | Freq: Three times a day (TID) | ORAL | Status: DC | PRN

## 2015-07-19 MED ORDER — BUPIVACAINE HCL 0.25 % IJ SOLN
INTRAMUSCULAR | Status: DC | PRN
  Administered 2015-07-19: 30 mL

## 2015-07-19 MED ORDER — MIDAZOLAM HCL 5 MG/ML IJ SOLN
1.0000 mg | Freq: Once | INTRAMUSCULAR | Status: AC
  Administered 2015-07-19: 1 mg via INTRAVENOUS
  Filled 2015-07-19: qty 1

## 2015-07-19 MED ORDER — MAGNESIUM CITRATE PO SOLN
1.0000 | Freq: Once | ORAL | Status: DC | PRN
  Filled 2015-07-19: qty 296

## 2015-07-19 MED ORDER — LIDOCAINE HCL (CARDIAC) 20 MG/ML IV SOLN
INTRAVENOUS | Status: DC | PRN
Start: 2015-07-19 — End: 2015-07-19
  Administered 2015-07-19: 100 mg via INTRAVENOUS

## 2015-07-19 MED ORDER — LACTATED RINGERS IV SOLN
INTRAVENOUS | Status: DC
  Administered 2015-07-19: 12:00:00 via INTRAVENOUS

## 2015-07-19 MED ORDER — DEXAMETHASONE SODIUM PHOSPHATE 4 MG/ML IJ SOLN
INTRAMUSCULAR | Status: DC | PRN
  Administered 2015-07-19: 4 mg via INTRAVENOUS

## 2015-07-19 MED ORDER — FAMOTIDINE 20 MG PO TABS
ORAL_TABLET | ORAL | Status: AC
  Administered 2015-07-19: 20 mg via ORAL
  Filled 2015-07-19: qty 1

## 2015-07-19 MED ORDER — BUPIVACAINE HCL (PF) 0.25 % IJ SOLN
INTRAMUSCULAR | Status: AC
  Filled 2015-07-19: qty 30

## 2015-07-19 MED ORDER — ALUM & MAG HYDROXIDE-SIMETH 200-200-20 MG/5ML PO SUSP: 30.0000 mL | ORAL | Status: DC | PRN

## 2015-07-19 MED ORDER — SUGAMMADEX SODIUM 500 MG/5ML IV SOLN
INTRAVENOUS | Status: DC | PRN
  Administered 2015-07-19: 258.6 mg via INTRAVENOUS

## 2015-07-19 MED ORDER — FENTANYL CITRATE (PF) 100 MCG/2ML IJ SOLN
25.0000 ug | INTRAMUSCULAR | Status: AC | PRN
  Administered 2015-07-19 (×6): 25 ug via INTRAVENOUS

## 2015-07-19 MED ORDER — PROPOFOL 10 MG/ML IV BOLUS
INTRAVENOUS | Status: DC | PRN
  Administered 2015-07-19: 200 mg via INTRAVENOUS

## 2015-07-19 MED ORDER — EPINEPHRINE HCL 1 MG/ML IJ SOLN
INTRAMUSCULAR | Status: AC
  Filled 2015-07-19: qty 1

## 2015-07-19 MED ORDER — FENTANYL CITRATE (PF) 100 MCG/2ML IJ SOLN
INTRAMUSCULAR | Status: AC
  Administered 2015-07-19: 25 ug via INTRAVENOUS
  Filled 2015-07-19: qty 2

## 2015-07-19 MED ORDER — OXYCODONE HCL 5 MG PO TABS: 5.0000 mg | ORAL_TABLET | ORAL | Status: DC | PRN

## 2015-07-19 MED ORDER — PHENOL 1.4 % MT LIQD
1.0000 | OROMUCOSAL | Status: DC | PRN
  Filled 2015-07-19: qty 177

## 2015-07-19 MED ORDER — DOCUSATE SODIUM 100 MG PO CAPS
100.0000 mg | ORAL_CAPSULE | Freq: Two times a day (BID) | ORAL | Status: DC
  Filled 2015-07-19: qty 1

## 2015-07-19 MED ORDER — HYDROMORPHONE HCL 1 MG/ML IJ SOLN
0.5000 mg | INTRAMUSCULAR | Status: AC | PRN
  Administered 2015-07-19 (×4): 0.5 mg via INTRAVENOUS

## 2015-07-19 MED ORDER — MIDAZOLAM HCL 5 MG/5ML IJ SOLN
INTRAMUSCULAR | Status: AC
  Administered 2015-07-19: 1 mg
  Filled 2015-07-19: qty 5

## 2015-07-19 MED ORDER — CEFAZOLIN SODIUM-DEXTROSE 2-3 GM-% IV SOLR
INTRAVENOUS | Status: AC
  Administered 2015-07-19: 2 g via INTRAVENOUS
  Filled 2015-07-19: qty 50

## 2015-07-19 MED ORDER — ONDANSETRON HCL 4 MG PO TABS: 4.0000 mg | ORAL_TABLET | Freq: Three times a day (TID) | ORAL | Status: DC | PRN

## 2015-07-19 MED ORDER — ONDANSETRON HCL 4 MG PO TABS: 4.0000 mg | ORAL_TABLET | Freq: Four times a day (QID) | ORAL | Status: DC | PRN

## 2015-07-19 MED ORDER — LACTATED RINGERS IV SOLN: INTRAVENOUS | Status: DC

## 2015-07-19 MED ORDER — FENTANYL CITRATE (PF) 100 MCG/2ML IJ SOLN
50.0000 ug | Freq: Once | INTRAMUSCULAR | Status: AC
  Administered 2015-07-19: 50 ug via INTRAVENOUS

## 2015-07-19 MED ORDER — ASPIRIN EC 325 MG PO TBEC
325.0000 mg | DELAYED_RELEASE_TABLET | Freq: Every day | ORAL | Status: DC
  Administered 2015-07-20: 325 mg via ORAL
  Filled 2015-07-19: qty 1

## 2015-07-19 MED ORDER — LIDOCAINE HCL (PF) 1 % IJ SOLN
INTRAMUSCULAR | Status: AC
  Filled 2015-07-19: qty 5

## 2015-07-19 MED ORDER — ROPIVACAINE HCL 5 MG/ML IJ SOLN
INTRAMUSCULAR | Status: AC
  Administered 2015-07-19: 20 mg via EPIDURAL
  Filled 2015-07-19: qty 20

## 2015-07-19 MED ORDER — FAMOTIDINE 20 MG PO TABS
20.0000 mg | ORAL_TABLET | Freq: Once | ORAL | Status: AC
  Administered 2015-07-19: 20 mg via ORAL

## 2015-07-19 MED ORDER — EPINEPHRINE HCL 1 MG/ML IJ SOLN
INTRAMUSCULAR | Status: DC | PRN
  Administered 2015-07-19: 1 mg

## 2015-07-19 MED ORDER — ALUM & MAG HYDROXIDE-SIMETH 200-200-20 MG/5ML PO SUSP
30.0000 mL | ORAL | Status: DC | PRN
  Administered 2015-07-19: 30 mL via ORAL
  Filled 2015-07-19: qty 30

## 2015-07-19 MED ORDER — ONDANSETRON HCL 4 MG/2ML IJ SOLN
INTRAMUSCULAR | Status: DC | PRN
  Administered 2015-07-19: 4 mg via INTRAVENOUS

## 2015-07-19 MED ORDER — LIDOCAINE HCL (PF) 1 % IJ SOLN
INTRAMUSCULAR | Status: AC
  Filled 2015-07-19: qty 30

## 2015-07-19 MED ORDER — ACETAMINOPHEN 325 MG PO TABS: 650.0000 mg | ORAL_TABLET | Freq: Four times a day (QID) | ORAL | Status: DC | PRN

## 2015-07-19 MED ORDER — CEFAZOLIN SODIUM-DEXTROSE 2-3 GM-% IV SOLR: 2.0000 g | Freq: Once | INTRAVENOUS | Status: DC

## 2015-07-19 MED ORDER — DIPHENHYDRAMINE HCL 12.5 MG/5ML PO ELIX: 12.5000 mg | ORAL_SOLUTION | ORAL | Status: DC | PRN

## 2015-07-19 MED ORDER — NEOMYCIN-POLYMYXIN B GU 40-200000 IR SOLN
Status: AC
  Filled 2015-07-19: qty 2

## 2015-07-19 MED ORDER — BISACODYL 10 MG RE SUPP: 10.0000 mg | Freq: Every day | RECTAL | Status: DC | PRN

## 2015-07-19 MED ORDER — LIDOCAINE HCL 1 % IJ SOLN
INTRAMUSCULAR | Status: DC | PRN
  Administered 2015-07-19: 10 mL

## 2015-07-19 MED ORDER — ACETAMINOPHEN 10 MG/ML IV SOLN
INTRAVENOUS | Status: DC | PRN
  Administered 2015-07-19: 1000 mg via INTRAVENOUS

## 2015-07-19 MED ORDER — HYDROMORPHONE HCL 1 MG/ML IJ SOLN: 1.0000 mg | INTRAMUSCULAR | Status: DC | PRN

## 2015-07-19 SURGICAL SUPPLY — 62 items
ADAPTER IRRIG TUBE 2 SPIKE SOL (ADAPTER) ×6 IMPLANT
ANCHOR DBLROW ROT CUFF 2.8 MAG (Anchor) ×3 IMPLANT
BUR RADIUS 4.0X18.5 (BURR) ×3 IMPLANT
BUR RADIUS 5.5 (BURR) ×3 IMPLANT
CANNULA 5.75X7 CRYSTAL CLEAR (CANNULA) IMPLANT
CANNULA PARTIAL THREAD 2X7 (CANNULA) IMPLANT
CANNULA TWIST IN 8.25X9CM (CANNULA) ×6 IMPLANT
CLOSURE WOUND 1/2 X4 (GAUZE/BANDAGES/DRESSINGS) ×1
CONNECTOR M SMARTSTITCH (Connector) ×3 IMPLANT
COOLER POLAR GLACIER W/PUMP (MISCELLANEOUS) ×3 IMPLANT
DRAPE IMP U-DRAPE 54X76 (DRAPES) ×6 IMPLANT
DRAPE INCISE IOBAN 66X45 STRL (DRAPES) ×3 IMPLANT
DRAPE U-SHAPE 47X51 STRL (DRAPES) ×3 IMPLANT
DURAPREP 26ML APPLICATOR (WOUND CARE) ×9 IMPLANT
GAUZE PETRO XEROFOAM 1X8 (MISCELLANEOUS) ×3 IMPLANT
GAUZE SPONGE 4X4 12PLY STRL (GAUZE/BANDAGES/DRESSINGS) ×6 IMPLANT
GLOVE BIOGEL PI IND STRL 9 (GLOVE) ×1 IMPLANT
GLOVE BIOGEL PI INDICATOR 9 (GLOVE) ×2
GLOVE SURG 9.0 ORTHO LTXF (GLOVE) ×6 IMPLANT
GOWN STRL REUS TWL 2XL XL LVL4 (GOWN DISPOSABLE) ×3 IMPLANT
GOWN STRL REUS W/ TWL LRG LVL3 (GOWN DISPOSABLE) ×1 IMPLANT
GOWN STRL REUS W/ TWL LRG LVL4 (GOWN DISPOSABLE) ×1 IMPLANT
GOWN STRL REUS W/TWL LRG LVL3 (GOWN DISPOSABLE) ×2
GOWN STRL REUS W/TWL LRG LVL4 (GOWN DISPOSABLE) ×2
IV LACTATED RINGER IRRG 3000ML (IV SOLUTION) ×12
IV LR IRRIG 3000ML ARTHROMATIC (IV SOLUTION) ×6 IMPLANT
KIT RM TURNOVER STRD PROC AR (KITS) ×3 IMPLANT
KIT STABILIZATION SHOULDER (MISCELLANEOUS) ×3 IMPLANT
MANIFOLD NEPTUNE II (INSTRUMENTS) ×3 IMPLANT
MASK FACE SPIDER DISP (MASK) ×3 IMPLANT
MAT BLUE FLOOR 46X72 FLO (MISCELLANEOUS) ×6 IMPLANT
NDL SAFETY 18GX1.5 (NEEDLE) ×3 IMPLANT
NDL SAFETY 22GX1.5 (NEEDLE) ×3 IMPLANT
NS IRRIG 500ML POUR BTL (IV SOLUTION) ×3 IMPLANT
PACK ARTHROSCOPY SHOULDER (MISCELLANEOUS) ×3 IMPLANT
PAD GROUND ADULT SPLIT (MISCELLANEOUS) ×3 IMPLANT
PAD WRAPON POLAR SHDR UNIV (MISCELLANEOUS) ×1 IMPLANT
PASSER SUT CAPTURE FIRST (SUTURE) ×3 IMPLANT
SET TUBE SUCT SHAVER OUTFL 24K (TUBING) ×3 IMPLANT
SET TUBE TIP INTRA-ARTICULAR (MISCELLANEOUS) ×3 IMPLANT
SLING ULTRA II M (MISCELLANEOUS) ×3 IMPLANT
STRIP CLOSURE SKIN 1/2X4 (GAUZE/BANDAGES/DRESSINGS) ×2 IMPLANT
SUT CO BRAID (SUTURE) ×6 IMPLANT
SUT ETHILON 4-0 (SUTURE) ×2
SUT ETHILON 4-0 FS2 18XMFL BLK (SUTURE) ×1
SUT KNTLS 2.8 MAGNUM (Anchor) ×6 IMPLANT
SUT MNCRL 4-0 (SUTURE) ×2
SUT MNCRL 4-0 27XMFL (SUTURE) ×1
SUT PDS AB 0 CT1 27 (SUTURE) ×6 IMPLANT
SUT VIC AB 0 CT1 36 (SUTURE) ×6 IMPLANT
SUT VIC AB 2-0 CT2 27 (SUTURE) ×3 IMPLANT
SUTURE ETHLN 4-0 FS2 18XMF BLK (SUTURE) ×1 IMPLANT
SUTURE MAGNUM WIRE 2X48 BLK (SUTURE) IMPLANT
SUTURE MNCRL 4-0 27XMF (SUTURE) ×1 IMPLANT
SUTURE OPUS MAGNUM SZ 2 WHT (SUTURE) ×6 IMPLANT
SYRINGE 10CC LL (SYRINGE) ×3 IMPLANT
TAPE MICROFOAM 4IN (TAPE) ×3 IMPLANT
TUBING ARTHRO INFLOW-ONLY STRL (TUBING) ×3 IMPLANT
TUBING CONNECTING 10 (TUBING) ×2 IMPLANT
TUBING CONNECTING 10' (TUBING) ×1
WAND HAND CNTRL MULTIVAC 90 (MISCELLANEOUS) ×3 IMPLANT
WRAPON POLAR PAD SHDR UNIV (MISCELLANEOUS) ×3

## 2015-07-19 NOTE — H&P (Signed)
The patient has been re-examined, and the chart reviewed, and there have been no interval changes to the documented history and physical.    The risks, benefits, and alternatives have been discussed at length, and the patient is willing to proceed.   

## 2015-07-19 NOTE — Anesthesia Preprocedure Evaluation (Signed)
Anesthesia Evaluation  Patient identified by MRN, date of birth, ID band Patient awake    Reviewed: Allergy & Precautions, NPO status , Patient's Chart, lab work & pertinent test results  Airway Mallampati: III       Dental  (+) Partial Upper   Pulmonary shortness of breath and with exertion, Current Smoker,     + decreased breath sounds      Cardiovascular negative cardio ROS   Rhythm:Regular Rate:Normal     Neuro/Psych    GI/Hepatic Neg liver ROS, GERD  ,  Endo/Other  Morbid obesity  Renal/GU negative Renal ROS     Musculoskeletal   Abdominal (+) + obese,   Peds  Hematology  (+) anemia ,   Anesthesia Other Findings   Reproductive/Obstetrics                             Anesthesia Physical Anesthesia Plan  ASA: III  Anesthesia Plan: General   Post-op Pain Management:    Induction: Intravenous  Airway Management Planned: Oral ETT  Additional Equipment:   Intra-op Plan:   Post-operative Plan: Extubation in OR  Informed Consent: I have reviewed the patients History and Physical, chart, labs and discussed the procedure including the risks, benefits and alternatives for the proposed anesthesia with the patient or authorized representative who has indicated his/her understanding and acceptance.     Plan Discussed with: CRNA  Anesthesia Plan Comments:         Anesthesia Quick Evaluation

## 2015-07-19 NOTE — Op Note (Signed)
07/19/2015  4:09 PM  PATIENT:  Diamond Mcclain  39 y.o. female  PRE-OPERATIVE DIAGNOSIS:  HIGH GRADE PARTIAL TEAR OF THE ROTATOR CUFF-left shoulder  POST-OPERATIVE DIAGNOSIS:  HIGH GRADE PARTIAL TEAR OF THE ROTATOR CUFF-left shoulder  PROCEDURE:  Procedure(s): SHOULDER ARTHROSCOPY WITH OPEN ROTATOR CUFF REPAIR (Left)  SURGEON:  Surgeon(s) and Role:    * Thornton Park, MD - Primary  ANESTHESIA:   local, general and paracervical block   PREOPERATIVE INDICATIONS:  Diamond Mcclain is a  39 y.o. female with a diagnosis of TENDON INJURY OF THE ROTATOR CUFF-left shoulder who failed conservative measures and elected for surgical management.    The risks benefits and alternatives were discussed with the patient preoperatively including but not limited to the risks of infection, bleeding, nerve injury, persistent pain or weakness, failure of the hardware, re-tear of the rotator cuff and the need for further surgery. Medical risks include DVT and pulmonary embolism, myocardial infarction, stroke, pneumonia, respiratory failure and death. Patient understood these risks and wished to proceed.  OPERATIVE IMPLANTS: ArthroCare Magnum M anchor 1, Magnum 2 anchors 2  OPERATIVE FINDINGS: High-grade partial-thickness tear of the supraspinatus, fraying of the superior labrum and partial tear of the superior fibers of the subscapularis.  OPERATIVE PROCEDURE: The patient was met in the preoperative area. The left shoulder was signed with the word yes and my initials according the hospital's correct site of surgery protocol. The patient underwent placement of an interscalene block by the anesthesia service in the preoperative area.  Patient was brought to the operating room where they underwent general endotracheal intubation. They were placed in a beachchair position.  A spider arm positioner was used for this case. Examination under anesthesia revealed no limitation of motion or instability with load shift  testing. The patient had a negative sulcus sign.  Patient was prepped and draped in a sterile fashion. A timeout was performed to verify the patient's name, date of birth, medical record number, correct site of surgery and correct procedure to be performed there was also used to verify the patient received antibiotics that all appropriate instruments, implants and radiographic studies were available in the room. Once all in attendance were in agreement case began.  Bony landmarks were drawn out with a surgical marker along with proposed arthroscopy incisions. These were pre-injected with 1% lidocaine plain. An 11 blade was used to establish a posterior portal through which the arthroscope was placed in the glenohumeral joint. A full diagnostic examination of the shoulder was performed.  Debridement of the frayed superior labrum, torn fibers of the superior subscapularis and supraspinatus were debrided with a 4-0 resector shaver blade.   A 4-0 resector shaver blade was then used to burr the greater tuberosity removing all torn fibers of the rotator cuff.  An 18-gauge spinal needle was used to place a 0 PDS suture to mark the location of the supraspinatus tear so it could be identified from the bursal surface.  The arthroscope was then placed in the subacromial space. The 0 PDS suture was identified.  The high-grade partial-thickness supraspinatus tear was completed with a 4-0 resector shaver blade.  Two Arthrocare Smart stitches were placed in the lateral border of the rotator cuff tear. All arthroscopic instruments were then removed and the mini-open portion of the procedure began.   A saber-type incision was made along the lateral border of the acromion. The deltoid muscle was identified and split in line with its fibers which allowed visualization of the rotator  cuff. The Smart stitches previously placed in the lateral border of the rotator cuff were brought out through the deltoid split.   A single Magnum  M anchor was placed at the articular margin of the humeral head with the greater tuberosity. The suture limbs of the Magnum M anchor were passed medially through the rotator cuff using a first pass suture passer.  The Smart stitches from the lateral border of the supraspinatus tear were then anchored to the greater tuberosity of the humeral head using two Magnum 2 anchors. These anchors were tensioned to allow for anatomic reduction of the rotator cuff to the greater tuberosity. Arthroscopic images of the repair were taken with the arthroscope both externally and from inside the glenohumeral joint.  All incisions were copiously irrigated. The deltoid fascia was repaired using a 0 Vicryl suture.  The subcutaneous tissue of all incisions were closed with a 2-0 Vicryl. Skin closure for the arthroscopic incisions was performed with 4-0 nylon. The skin edges of the saber incision was approximated with a running 4-0 undyed Monocryl.  A dry sterile dressing was applied.  The patient was placed in an abduction sling, with a Polar Care sleeve, a TENS unit.  All sharp and it instrument counts were correct at the conclusion of the case. I was scrubbed and present for the entire case. I spoke with the patient's family postoperatively to let them know the case had gone without complication and the patient was stable in recovery room.    Timoteo Gaul, MD

## 2015-07-19 NOTE — Transfer of Care (Signed)
Immediate Anesthesia Transfer of Care Note  Patient: Diamond Mcclain  Procedure(s) Performed: Procedure(s): SHOULDER ARTHROSCOPY WITH OPEN ROTATOR CUFF REPAIR (Left)  Patient Location: PACU  Anesthesia Type:General  Level of Consciousness: awake, alert  and oriented  Airway & Oxygen Therapy: Patient Spontanous Breathing and Patient connected to face mask oxygen  Post-op Assessment: Report given to RN and Post -op Vital signs reviewed and stable  Post vital signs: Reviewed and stable  Last Vitals:  Filed Vitals:   07/19/15 1555  BP: 168/80  Pulse: 97  Temp: 36.6 C  Resp: 20    Complications: No apparent anesthesia complications

## 2015-07-19 NOTE — Anesthesia Procedure Notes (Addendum)
Anesthesia Regional Block:  Interscalene brachial plexus block  Pre-Anesthetic Checklist: ,, timeout performed, Correct Patient, Correct Site, Correct Laterality, Correct Procedure, Correct Position, site marked, Risks and benefits discussed,  Surgical consent,  Pre-op evaluation,  At surgeon's request and post-op pain management  Laterality: Left  Prep: alcohol swabs       Needles:  Injection technique: Single-shot  Needle Type: Stimulator Needle - 40     Needle Length: 9cm 9 cm Needle Gauge: 20 and 20 G    Additional Needles:  Procedures: nerve stimulator Interscalene brachial plexus block  Nerve Stimulator or Paresthesia:  Response: 0.6 mA, 1 ms, 3 cm  Additional Responses:   Narrative:  Start time: 07/19/2015 12:35 PM End time: 07/19/2015 12:47 PM Injection made incrementally with aspirations every 5 mL.  Performed by: Personally  Anesthesiologist: Elijio MilesVAN STAVEREN, GIJSBERTUS F   Procedure Name: Intubation Date/Time: 07/19/2015 1:06 PM Performed by: Omer JackWEATHERLY, Cayle Thunder Pre-anesthesia Checklist: Patient identified, Patient being monitored, Timeout performed, Emergency Drugs available and Suction available Patient Re-evaluated:Patient Re-evaluated prior to inductionOxygen Delivery Method: Circle system utilized Preoxygenation: Pre-oxygenation with 100% oxygen Intubation Type: IV induction Ventilation: Mask ventilation without difficulty Laryngoscope Size: Mac and 3 Grade View: Grade I Tube type: Oral Tube size: 7.0 mm Number of attempts: 1 Airway Equipment and Method: Stylet Placement Confirmation: ETT inserted through vocal cords under direct vision,  positive ETCO2 and breath sounds checked- equal and bilateral Secured at: 21 cm Tube secured with: Tape Dental Injury: Teeth and Oropharynx as per pre-operative assessment

## 2015-07-19 NOTE — Discharge Instructions (Signed)
ORTHOPAEDIC DISCHARGE INSTRUCTIONS  Wear sling at all times, including sleep.  You will need to use the sling for a total of 4 weeks following surgery.  Do not try and lift your arm away from your body for any reason.   Keep the dressing dry.  You may remove bandage in 3 days.  Leave the Steri-Strips (white medical tape) in place.  You may place additional Band-Aids over top of the Steri-Strips if you wish.  May shower once dressing is removed in 3 days.  Remove sling carefully only for showers, leaving arm down by your side while in the shower.  Make sure to take some pain medication this evening before you fall asleep, in preparation for the nerve block wearing off in the middle of the night.  If the the pain medication causes itching, or is too strong, try taking a single tablet at a time, or combining with Benadryl.  You may be most comfortable sleeping in a recliner.  If you do sleep in near bed, placed pillows behind the shoulder that have the operation to support it.     AMBULATORY SURGERY  DISCHARGE INSTRUCTIONS   1) The drugs that you were given will stay in your system until tomorrow so for the next 24 hours you should not:  A) Drive an automobile B) Make any legal decisions C) Drink any alcoholic beverage   2) You may resume regular meals tomorrow.  Today it is better to start with liquids and gradually work up to solid foods.  You may eat anything you prefer, but it is better to start with liquids, then soup and crackers, and gradually work up to solid foods.   3) Please notify your doctor immediately if you have any unusual bleeding, trouble breathing, redness and pain at the surgery site, drainage, fever, or pain not relieved by medication.    4) Additional Instructions:  Please contact your physician with any problems or Same Day Surgery at [phone], Monday through Friday 6 am to 4 pm, or [place] at [facility] Main number at [phone].

## 2015-07-20 ENCOUNTER — Encounter: Payer: Self-pay | Admitting: Orthopedic Surgery

## 2015-07-20 NOTE — Progress Notes (Signed)
  Subjective:  Postoperative day 1 status post left mini open rotator cuff repair. Patient reports pain as marked.  Patient states she has had nausea and vomiting. I reviewed the PT and OT notes. Patient was up out of bed to a chair this morning.  Objective:   VITALS:   Filed Vitals:   07/19/15 2301 07/20/15 0011 07/20/15 0435 07/20/15 0735  BP: 108/61 105/56 124/92 115/60  Pulse: 81 67 62 73  Temp: 97.8 F (36.6 C) 97.9 F (36.6 C) 97.8 F (36.6 C) 98.1 F (36.7 C)  TempSrc: Oral Oral Oral Oral  Resp: 18 18 18 18   Height:      Weight:      SpO2: 94% 97% 100% 100%    PHYSICAL EXAM:  Left shoulder: Patient's dressing is clean dry and intact. Patient has a Polar Care, TENS unit and abduction sling in place. I made adjustments to the sling to help the patient with comfort. She has intact sensation light touch in her left hand. She has a palpable radial pulse and intact sensation in all digits.   LABS  No results found for this or any previous visit (from the past 24 hour(s)).  No results found.  Assessment/Plan: 1 Day Post-Op   Active Problems:   S/P rotator cuff repair  Patient has improved pain overnight. She still complains of pain but this is responding to oxycodone. She will be discharged home today with prescriptions for oxycodone and Zofran for nausea or vomiting. She'll follow up with me in 1 week in my office. Patient should continue to rest and elevate her left upper extremity. She must wear the sling at all times.    Juanell FairlyKRASINSKI, Avid Guillette , MD 07/20/2015, 12:05 PM

## 2015-07-20 NOTE — Plan of Care (Signed)
Problem: Pain Management: Goal: Pain level will decrease with appropriate interventions Outcome: Not Progressing Pain medication not controlled with PRN medication. Patient states that she prefers IV pain medication versus oral.Patient teaching giving to patient on need to control pain with oral medication to ensure a smooth transfer for discharge. Polar care and sling intact.

## 2015-07-20 NOTE — Progress Notes (Signed)
Occupational Therapy Treatment Patient Details Name: Diamond Mcclain MRN: [id number] DOB: 04/10/1976 Today's Date: 07/20/2015    History of present illness admitted for acute hospitalization status post L RTC (11/16) for high-grade partial thickness tear of supraspinatus, fraying of superior labrum and partial tear of superior fibers of subscapularis.   OT comments  See eval   Follow Up Recommendations  No OT follow up    Equipment Recommendations       Recommendations for Other Services PT consult    Precautions / Restrictions Precautions Precautions: Fall Required Braces or Orthoses: Sling;Other Brace/Splint Other Brace/Splint: L shoulder immobilizer Restrictions Weight Bearing Restrictions: Yes LUE Weight Bearing: Non weight bearing       Mobility Bed Mobility Overal bed mobility: Independent                Transfers Overall transfer level: Modified independent Equipment used: None Transfers: Sit to/from Stand                Balance Overall balance assessment: Needs assistance Sitting-balance support: No upper extremity supported;Feet supported Sitting balance-Leahy Scale: Good     Standing balance support: No upper extremity supported Standing balance-Leahy Scale: Good                     ADL                                                Vision                     Perception     Praxis      Cognition   Behavior During Therapy: WFL for tasks assessed/performed Overall Cognitive Status: Within Functional Limits for tasks assessed                       Extremity/Trunk Assessment  Upper Extremity Assessment Upper Extremity Assessment:  (R UE grossly WFL; L UE immobilized in sling)   Lower Extremity Assessment Lower Extremity Assessment: Overall WFL for tasks assessed (grossly at least 4+/5 throughout)        Exercises Other Exercises Other Exercises: Ambulation in room and to BR with  no LOB and no assistive device; LB dressing can doff socks using reacher , but unable to use sock aid because of to much pressure to pull over sockaid and could not pull on because of calf block it  Other Exercises: info provided about toilet aids to make her Ind in toilet hygiene - and when donning bra cannot pull yet - need to fasten in front when shoulder precautions release    Shoulder Instructions       General Comments      Pertinent Vitals/ Pain       Pain Assessment: 0-10 Pain Score: 9  Pain Location: L shoulder Pain Descriptors / Indicators: Aching Pain Intervention(s): Limited activity within patient's tolerance;Monitored during session;Premedicated before session;Repositioned  Home Living Family/patient expects to be discharged to:: Private residence Living Arrangements: Non-relatives/Friends Available Help at Discharge: Friend(s);Available 24 hours/day Type of Home: House Home Access: Stairs to enter Enter[employer] CorporationEntrance Stairs-Number of Steps: 1 Entrance Stairs-Rails: None Home Layout: One level     Bathroom Shower/Tub: Chief Strategy OfficerTub/shower unit   Bathroom Toilet: Standard Bathroom Accessibility: Yes   Home Equipment: None  Prior Functioning/Environment Level of Independence: Independent        Comments: Indep with household and community mobility; denies fall history outside of the fall that injured her shoulder.  Works as Editor, commissioning.   Frequency       Progress Toward Goals  OT Goals(current goals can now be found in the care plan section)     Acute Rehab OT Goals Patient Stated Goal: "to go home"  Plan      Co-evaluation                 End of Session Equipment Utilized During Treatment: Gait belt   Activity Tolerance Patient tolerated treatment well   Patient Left in chair;with chair alarm set   Nurse Communication          Time:  -     Charges: OT Evaluation $Initial OT Evaluation Tier I: 1  Procedure  Oletta Cohn 07/20/2015, 11:42 AM

## 2015-07-20 NOTE — Evaluation (Signed)
Physical Therapy Evaluation Patient Details Name: Diamond Citizenancy M Coppinger MRN: [id number] DOB: 01/22/1976 Today's Date: 07/20/2015   History of Present Illness  admitted for acute hospitalization status post L RTC (11/16) for high-grade partial thickness tear of supraspinatus, fraying of superior labrum and partial tear of superior fibers of subscapularis.  Clinical Impression  Upon evaluation, patient alert and oriented; follows all commands and demonstrates good insight/safety awareness.  Rates L shoulder pain 9/10; meds received prior to session.  Able to actively flex/extend L wrist and grasp/release fist without difficulty; denies sensory deficit.  Currently completes bed mobility indep; sit/stand, basic transfers and gait (50') without assist device, distant sup/mod indep; stairs x4 with single rail, cga/close sup.  No buckling, LOB or significant safety concerns noted during evaluation. Will continue to see patient during hospitalization for progressive mobilization; PT follow up per MD upon discharge.    Follow Up Recommendations No PT follow up    Equipment Recommendations       Recommendations for Other Services       Precautions / Restrictions Precautions Precautions: Fall Required Braces or Orthoses: Other Brace/Splint Other Brace/Splint: L shoulder immobilizer Restrictions Weight Bearing Restrictions: Yes LUE Weight Bearing: Non weight bearing      Mobility  Bed Mobility Overal bed mobility: Independent                Transfers Overall transfer level: Modified independent Equipment used: Mcclain Transfers: Sit to/from Stand              Ambulation/Gait Ambulation/Gait assistance: Supervision;Min guard Ambulation Distance (Feet): 50 Feet (x2) Assistive device: Mcclain       General Gait Details: broad BOS with excessive lateral sway (due to body composition vs. pathology); able to change directions, complete head turns and participate with divided attention  tasks during gait without LOB or safety concern.  Stairs Stairs: Yes Stairs assistance: Min guard Stair Management: One rail Right Number of Stairs: 4 General stair comments: reciprocal stepping without buckling or LOB  Wheelchair Mobility    Modified Rankin (Stroke Patients Only)       Balance Overall balance assessment: Needs assistance Sitting-balance support: No upper extremity supported;Feet supported Sitting balance-Leahy Scale: Good     Standing balance support: No upper extremity supported Standing balance-Leahy Scale: Good                               Pertinent Vitals/Pain Pain Assessment: 0-10 Pain Score: 9  Pain Descriptors / Indicators: Aching Pain Intervention(s): Limited activity within patient's tolerance;Monitored during session;Premedicated before session;Repositioned    Home Living Family/patient expects to be discharged to:: Private residence Living Arrangements: Non-relatives/Friends Available Help at Discharge: Friend(s);Available 24 hours/day Type of Home: House Home Access: Stairs to enter Entrance Stairs-Rails: Mcclain Entrance Stairs-Number of Steps: 1 Home Layout: One level Home Equipment: Mcclain      Prior Function Level of Independence: Independent         Comments: Indep with household and community mobility; denies fall history outside of the fall that injured her shoulder.  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     Hand Dominance   Dominant Hand: Right    Extremity/Trunk Assessment   Upper Extremity Assessment:  (R UE grossly WFL; L UE immobilized in sling)           Lower Extremity Assessment: Overall WFL for tasks assessed (grossly at least 4+/5 throughout)  Communication   Communication: No difficulties  Cognition Arousal/Alertness: Awake/alert Behavior During Therapy: WFL for tasks assessed/performed Overall Cognitive Status: Within Functional Limits for tasks  assessed                      General Comments      Exercises Other Exercises Other Exercises: Toilet transfer, ambulatory without assist device, mod indep.  Able to negotiate surface changes, narrowed environments without difficulty.  Sit/stand from standard toilet height, mod indep.  Dep for hygiene (unable to reach with R UE due to body habitus)      Assessment/Plan    PT Assessment Patent does not need any further PT services  PT Diagnosis Generalized weakness;Acute pain;Difficulty walking   PT Problem List Decreased activity tolerance;Decreased mobility;Pain;Obesity  PT Treatment Interventions Gait training;Stair training;Functional mobility training;Patient/family education   PT Goals (Current goals can be found in the Care Plan section) Acute Rehab PT Goals Patient Stated Goal: "to go home" PT Goal Formulation: With patient Time For Goal Achievement: 08/03/15 Potential to Achieve Goals: Good    Frequency BID   Barriers to discharge        Co-evaluation               End of Session Equipment Utilized During Treatment: Gait belt Activity Tolerance: Patient tolerated treatment well Patient left: in chair;with call bell/phone within reach (OT with patient for evaluation) Nurse Communication: Mobility status    Functional Assessment Tool Used: clinical judgment Functional Limitation: Mobility: Walking and moving around Mobility: Walking and Moving Around Current Status [phone]): At least 20 percent but less than 40 percent impaired, limited or restricted Mobility: Walking and Moving Around Goal Status [phone]): At least 1 percent but less than 20 percent impaired, limited or restricted    Time: 0915-0935 PT Time Calculation (min) (ACUTE ONLY): 20 min   Charges:   PT Evaluation $Initial PT Evaluation Tier I: 1 Procedure PT Treatments $Therapeutic Activity: 8-22 mins   PT G Codes:   PT G-Codes **NOT FOR INPATIENT CLASS** Functional Assessment Tool  Used: clinical judgment Functional Limitation: Mobility: Walking and moving around Mobility: Walking and Moving Around Current Status (U9811): At least 20 percent but less than 40 percent impaired, limited or restricted Mobility: Walking and Moving Around Goal Status [phone]): At least 1 percent but less than 20 percent impaired, limited or restricted    Kahlee Metivier H. Manson Passey, PT, DPT, NCS 07/20/2015, 10:23 AM [phone]

## 2015-07-20 NOTE — Progress Notes (Signed)
Discharge Note:  Pt given discharge instructions and prescriptions, Pt verbalized understanding. Pt wheeled to car by staff.

## 2015-07-20 NOTE — Evaluation (Signed)
Occupational Therapy Evaluation Patient Details Name: Diamond Mcclain MRN: [id number] DOB: 08-Jun-1976 Today's Date: 07/20/2015    History of Present Illness admitted for acute hospitalization status post L RTC (11/16) for high-grade partial thickness tear of supraspinatus, fraying of superior labrum and partial tear of superior fibers of subscapularis.   Clinical Impression   Pt present with decrease LB dressing - unable to use sockaid but can use reacher - info provided Also decrease abilty for toilet hygiene - info provided on toilet aids  Functional mobility pt showed no LOB and did not need any assistive advice  R UE ROM WNL -and R hand dominant  No OT services needed in this setting     Follow Up Recommendations  No OT follow up    Equipment Recommendations       Recommendations for Other Services PT consult     Precautions / Restrictions Precautions Precautions: Fall Required Braces or Orthoses: Sling;Other Brace/Splint Other Brace/Splint: L shoulder immobilizer Restrictions Weight Bearing Restrictions: Yes LUE Weight Bearing: Non weight bearing      Mobility Bed Mobility Overal bed mobility: Independent                Transfers Overall transfer level: Modified independent Equipment used: None Transfers: Sit to/from Stand                Balance Overall balance assessment: Needs assistance Sitting-balance support: No upper extremity supported;Feet supported Sitting balance-Leahy Scale: Good     Standing balance support: No upper extremity supported Standing balance-Leahy Scale: Good                              ADL                                               Vision     Perception     Praxis      Pertinent Vitals/Pain Pain Assessment: 0-10 Pain Score: 9  Pain Location: L shoulder Pain Descriptors / Indicators: Aching Pain Intervention(s): Limited activity within patient's tolerance;Monitored  during session;Premedicated before session;Repositioned     Hand Dominance Right   Extremity/Trunk Assessment Upper Extremity Assessment Upper Extremity Assessment:  (R UE grossly WFL; L UE immobilized in sling)   Lower Extremity Assessment Lower Extremity Assessment: Overall WFL for tasks assessed (grossly at least 4+/5 throughout)       Communication Communication Communication: No difficulties   Cognition Arousal/Alertness: Awake/alert Behavior During Therapy: WFL for tasks assessed/performed Overall Cognitive Status: Within Functional Limits for tasks assessed                     General Comments       Exercises   Other Exercises Other Exercises: Ambulation in room and to BR with no LOB and no assistive device; LB dressing can doff socks using reacher , but unable to use sock aid because of to much pressure to pull over sockaid and could not pull on because of calf block it  Other Exercises: info provided about toilet aids to make her Ind in toilet hygiene - and when donning bra cannot pull yet - need to fasten in front when shoulder precautions release    Shoulder Instructions      Home Living Family/patient expects to be discharged to:: Private residence Living  Arrangements: Non-relatives/Friends Available Help at Discharge: Friend(s);Available 24 hours/day Type of Home: House Home Access: Stairs to enter [employer] CorporationEntrance Stairs-Number of Steps: 1 Entrance Stairs-Rails: None Home Layout: One level     Bathroom Shower/Tub: Chief Strategy OfficerTub/shower unit   Bathroom Toilet: Standard Bathroom Accessibility: Yes   Home Equipment: None          Prior Functioning/Environment Level of Independence: Independent        Comments: Indep with household and community mobility; denies fall history outside of the fall that injured her shoulder.  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    OT Diagnosis:     OT Problem List:     OT Treatment/Interventions:       OT Goals(Current goals can be found in the care plan section) Acute Rehab OT Goals Patient Stated Goal: "to go home"  OT Frequency:     Barriers to D/C:            Co-evaluation              End of Session Equipment Utilized During Treatment: Gait belt  Activity Tolerance: Patient tolerated treatment well Patient left: in chair;with chair alarm set   Time:  -    Charges:  OT Evaluation $Initial OT Evaluation Tier I: 1 Procedure G-Codes:    Oletta CohnuPreez, Tamerra Merkley OTR/L,CLT 07/20/2015, 11:40 AM

## 2015-07-20 NOTE — Discharge Summary (Signed)
Physician Discharge Summary  Patient ID: Diamond Mcclain MRN: [id number] DOB/AGE: 38/12/1975 38 y.o.  Admit date: 07/19/2015 Discharge date: 07/20/2015  Admission Diagnoses:  HIGH GRADE PARTIAL THICKNESS ROTATOR CUFF OF THE SUPRASPINATUS-left shoulder S/P MINI OPEN ROTATOR CUFF REPAIR  Discharge Diagnoses:  HIGH GRADE PARTIAL THICKNESS ROTATOR CUFF OF THE SUPRASPINATUS-left shoulder Active Problems:   S/P rotator cuff repair   Past Medical History  Diagnosis Date  . GERD (gastroesophageal reflux disease)     H/O  . Anemia     H/O    Surgeries: Procedure(s): SHOULDER ARTHROSCOPY WITH OPEN ROTATOR CUFF REPAIR on 07/19/2015   Consultants (if any):    Discharged Condition: Improved  Hospital Course: Diamond Mcclain is an 39 y.o. female who was admitted 07/19/2015 for pain control after shoulder arthroscopy with a mini open rotator cuff repair. Patient had significant pain postoperatively and was given oxycodone and I wanted IV for pain control. She also expressed nausea and vomiting which was treated with Zofran. Patient's pain improved overnight. Her clinical stability she is prepared for discharge home.  She was given perioperative antibiotics:  Anti-infectives    Start     Dose/Rate Route Frequency Ordered Stop   07/19/15 1800  ceFAZolin (ANCEF) IVPB 2 g/50 mL premix     2 g 100 mL/hr over 30 Minutes Intravenous Every 6 hours 07/19/15 1755 07/20/15 1159   07/19/15 1200  ceFAZolin (ANCEF) IVPB 2 g/50 mL premix  Status:  Discontinued     2 g 100 mL/hr over 30 Minutes Intravenous  Once 07/19/15 1152 07/19/15 1753   07/19/15 1137  ceFAZolin (ANCEF) 2-3 GM-% IVPB SOLR    Comments:  LEWIS, CINDY: cabinet override      07/19/15 1137 07/19/15 1303    .  She was given sequential compression devices, early ambulation, and ECASA 325mg  for DVT prophylaxis.  She benefited maximally from the hospital stay and there were no complications.    Recent vital signs:  Filed Vitals:   07/20/15 0735  BP: 115/60  Pulse: 73  Temp: 98.1 F (36.7 C)  Resp: 18    Recent laboratory studies:  Lab Results  Component Value Date   HGB 12.6 07/05/2015   HGB 13.4 09/06/2012   HGB 12.7 10/25/2011   Lab Results  Component Value Date   WBC 11.4* 07/05/2015   PLT 315 07/05/2015   Lab Results  Component Value Date   INR 0.93 07/05/2015   Lab Results  Component Value Date   NA 138 07/05/2015   K 3.9 07/05/2015   CL 105 07/05/2015   CO2 25 07/05/2015   BUN 11 07/05/2015   CREATININE 0.76 07/05/2015   GLUCOSE 77 07/05/2015    Discharge Medications:     Medication List    TAKE these medications        cyclobenzaprine 5 MG tablet  Commonly known as:  FLEXERIL  Take 1 tablet (5 mg total) by mouth every 8 (eight) hours as needed for muscle spasms.     ibuprofen 800 MG tablet  Commonly known as:  ADVIL,MOTRIN  Take 1 tablet (800 mg total) by mouth every 8 (eight) hours as needed.     ondansetron 4 MG tablet  Commonly known as:  ZOFRAN  Take 1 tablet (4 mg total) by mouth every 8 (eight) hours as needed for nausea or vomiting.     oxyCODONE 5 MG immediate release tablet  Commonly known as:  Oxy IR/ROXICODONE  Take 1-2 tablets (5-10 mg total) by  mouth every 4 (four) hours as needed for severe pain.        Diagnostic Studies: No results found.  Disposition: 01-Home or Self Care  Wear sling at all times, including sleep.  You will need to use the sling for a total of 4 weeks following surgery.  Do not try and lift your arm away from your body for any reason.   Keep the dressing dry.  You may remove bandage in 3 days.  Leave the Steri-Strips (white medical tape) in place.  You may place additional Band-Aids over top of the Steri-Strips if you wish.  May shower once dressing is removed in 3 days.  Remove sling carefully only for showers, leaving arm down by your side while in the shower.  If the the pain medication causes itching try taking Benadryl.  You  may be most comfortable sleeping in a recliner.  If you do sleep in near bed, placed pillows behind the shoulder that have the operation to support it.      Follow-up Information    Follow up with Juanell Fairly, MD. Go in 7 days.   Specialty:  Orthopedic Surgery   Why:  For wound re-check, 11 AM  07/26/15   Contact information:   [address] [place] [address] [phone]        Signed: Juanell Fairly ,MD 07/20/2015, 12:08 PM

## 2015-07-21 NOTE — Anesthesia Postprocedure Evaluation (Signed)
  Anesthesia Post-op Note  Patient: Diamond Mcclain  Procedure(s) Performed: Procedure(s): SHOULDER ARTHROSCOPY WITH OPEN ROTATOR CUFF REPAIR (Left)  Anesthesia type:General  Patient location: PACU  Post pain: Pain level controlled  Post assessment: Post-op Vital signs reviewed, Patient's Cardiovascular Status Stable, Respiratory Function Stable, Patent Airway and No signs of Nausea or vomiting  Post vital signs: Reviewed and stable  Last Vitals:  Filed Vitals:   07/20/15 0735  BP: 115/60  Pulse: 73  Temp: 36.7 C  Resp: 18    Level of consciousness: awake, alert  and patient cooperative  Complications: No apparent anesthesia complications

## 2016-04-19 DIAGNOSIS — Z9889 Other specified postprocedural states: Secondary | ICD-10-CM | POA: Insufficient documentation

## 2016-05-27 ENCOUNTER — Other Ambulatory Visit: Payer: Self-pay | Admitting: Orthopedic Surgery

## 2016-05-28 ENCOUNTER — Encounter
Admission: RE | Admit: 2016-05-28 | Discharge: 2016-05-28 | Disposition: A | Discharge status: Home / Self Care | Payer: Self-pay | Source: Ambulatory Visit | Attending: Orthopedic Surgery | Admitting: Orthopedic Surgery

## 2016-05-28 HISTORY — DX: Nausea with vomiting, unspecified: R11.2

## 2016-05-28 HISTORY — DX: Nausea with vomiting, unspecified: Z98.890

## 2016-05-28 NOTE — Patient Instructions (Signed)
  Your procedure is scheduled on: Tuesday Jun 04, 2016. Report to Same Day Surgery. To find out your arrival time please call [phone] between 1PM - 3PM on Monday Oct.2, 2017.  Remember: Instructions that are not followed completely may result in serious medical risk, up to and including death, or upon the discretion of your surgeon and anesthesiologist your surgery may need to be rescheduled.    _x___ 1. Do not eat food or drink liquids after midnight. No gum chewing or hard candies.     ____ 2. No Alcohol for 24 hours before or after surgery.   ____ 3. Bring all medications with you on the day of surgery if instructed.    __x__ 4. Notify your doctor if there is any change in your medical condition     (cold, fever, infections).    __x___ 5. No smoking 24 hours prior to surgery.     Do not wear jewelry, make-up, hairpins, clips or nail polish.  Do not wear lotions, powders, or perfumes.   Do not shave 48 hours prior to surgery. Men may shave face and neck.  Do not bring valuables to the hospital.    [facility] is not responsible for any belongings or valuables.               Contacts, dentures or bridgework may not be worn into surgery.  Leave your suitcase in the car. After surgery it may be brought to your room.  For patients admitted to the hospital, discharge time is determined by your treatment team.   Patients discharged the day of surgery will not be allowed to drive home.    Please read over the following fact sheets that you were given:   [facility]  ____ Take these medicines the morning of surgery with A SIP OF WATER: None     ____ Fleet Enema (as directed)   _x___ Use CHG Soap as directed on instruction sheet  ____ Use inhalers on the day of surgery and bring to hospital day of surgery  ____ Stop metformin 2 days prior to surgery    ____ Take 1/2 of usual insulin dose the night before surgery and none on the morning of surgery.    ____ Stop Coumadin/Plavix/aspirin on does not apply.  __x__ Stop Anti-inflammatories such as Advil, Aleve, Ibuprofen, Motrin, Naproxen, Naprosyn, Goodies powders or aspirin products. OK to take Tylenol.   ____ Stop supplements until after surgery.    ____ Bring C-Pap to the hospital.

## 2016-05-30 ENCOUNTER — Encounter
Admission: RE | Admit: 2016-05-30 | Discharge: 2016-05-30 | Disposition: A | Discharge status: Home / Self Care | Payer: Self-pay | Source: Ambulatory Visit | Attending: Orthopedic Surgery | Admitting: Orthopedic Surgery

## 2016-05-30 DIAGNOSIS — M25512 Pain in left shoulder: Secondary | ICD-10-CM | POA: Insufficient documentation

## 2016-05-30 DIAGNOSIS — Z9889 Other specified postprocedural states: Secondary | ICD-10-CM | POA: Insufficient documentation

## 2016-05-30 DIAGNOSIS — Z01812 Encounter for preprocedural laboratory examination: Secondary | ICD-10-CM | POA: Insufficient documentation

## 2016-05-30 LAB — CBC WITH DIFFERENTIAL/PLATELET
Basophils Absolute: 0.1 10*3/uL (ref 0–0.1)
Basophils Relative: 1 %
EOS PCT: 9 %
Eosinophils Absolute: 0.9 10*3/uL — ABNORMAL HIGH (ref 0–0.7)
HEMATOCRIT: 40.8 % (ref 35.0–47.0)
HEMOGLOBIN: 14 g/dL (ref 12.0–16.0)
LYMPHS ABS: 2.5 10*3/uL (ref 1.0–3.6)
LYMPHS PCT: 23 %
MCH: 28.2 pg (ref 26.0–34.0)
MCHC: 34.4 g/dL (ref 32.0–36.0)
MCV: 82 fL (ref 80.0–100.0)
Monocytes Absolute: 0.8 10*3/uL (ref 0.2–0.9)
Monocytes Relative: 8 %
NEUTROS ABS: 6.6 10*3/uL — AB (ref 1.4–6.5)
Neutrophils Relative %: 59 %
Platelets: 248 10*3/uL (ref 150–440)
RBC: 4.98 MIL/uL (ref 3.80–5.20)
RDW: 16 % — ABNORMAL HIGH (ref 11.5–14.5)
WBC: 10.9 10*3/uL (ref 3.6–11.0)

## 2016-05-30 LAB — PROTIME-INR
INR: 0.9
Prothrombin Time: 12.1 seconds (ref 11.4–15.2)

## 2016-05-30 LAB — BASIC METABOLIC PANEL
ANION GAP: 5 (ref 5–15)
BUN: 13 mg/dL (ref 6–20)
CHLORIDE: 107 mmol/L (ref 101–111)
CO2: 25 mmol/L (ref 22–32)
Calcium: 8.8 mg/dL — ABNORMAL LOW (ref 8.9–10.3)
Creatinine, Ser: 0.88 mg/dL (ref 0.44–1.00)
GFR calc Af Amer: >60 mL/min (ref >60)
GFR calc non Af Amer: >60 mL/min (ref >60)
Glucose, Bld: 81 mg/dL (ref 65–99)
POTASSIUM: 4.2 mmol/L (ref 3.5–5.1)
SODIUM: 137 mmol/L (ref 135–145)

## 2016-05-30 LAB — APTT: aPTT: 28 seconds (ref 24–36)

## 2016-06-04 ENCOUNTER — Encounter: Payer: Self-pay | Admitting: *Deleted

## 2016-06-04 ENCOUNTER — Ambulatory Visit
Admission: RE | Admit: 2016-06-04 | Discharge: 2016-06-04 | Disposition: A | Discharge status: Home / Self Care | Payer: Self-pay | Source: Ambulatory Visit | Attending: Orthopedic Surgery | Admitting: Orthopedic Surgery

## 2016-06-04 ENCOUNTER — Ambulatory Visit: Payer: Self-pay | Admitting: Certified Registered Nurse Anesthetist

## 2016-06-04 ENCOUNTER — Encounter
Admission: RE | Disposition: A | Discharge status: Home / Self Care | Payer: Self-pay | Source: Ambulatory Visit | Attending: Orthopedic Surgery

## 2016-06-04 DIAGNOSIS — M7502 Adhesive capsulitis of left shoulder: Secondary | ICD-10-CM | POA: Insufficient documentation

## 2016-06-04 DIAGNOSIS — M25512 Pain in left shoulder: Secondary | ICD-10-CM | POA: Insufficient documentation

## 2016-06-04 DIAGNOSIS — Z6841 Body Mass Index (BMI) 40.0 and over, adult: Secondary | ICD-10-CM | POA: Insufficient documentation

## 2016-06-04 HISTORY — PX: SHOULDER ARTHROSCOPY WITH BICEPSTENOTOMY: SHX6204

## 2016-06-04 HISTORY — DX: Unspecified ovarian cyst, unspecified side: N83.209

## 2016-06-04 HISTORY — PX: LYSIS OF ADHESION: SHX5961

## 2016-06-04 HISTORY — PX: SUBACROMIAL DECOMPRESSION: SHX5174

## 2016-06-04 SURGERY — SHOULDER ARTHROSCOPY WITH BICEPS TENOTOMY
Anesthesia: Regional | Site: Shoulder | Laterality: Left | Wound class: Clean

## 2016-06-04 MED ORDER — SUCCINYLCHOLINE CHLORIDE 200 MG/10ML IV SOSY
PREFILLED_SYRINGE | INTRAVENOUS | Status: DC | PRN
  Administered 2016-06-04: 120 mg via INTRAVENOUS

## 2016-06-04 MED ORDER — SUGAMMADEX SODIUM 500 MG/5ML IV SOLN
INTRAVENOUS | Status: DC | PRN
  Administered 2016-06-04: 260 mg via INTRAVENOUS

## 2016-06-04 MED ORDER — OXYCODONE HCL 5 MG PO TABS: 5.0000 mg | ORAL_TABLET | ORAL | 0 refills | Status: DC | PRN

## 2016-06-04 MED ORDER — DEXAMETHASONE SODIUM PHOSPHATE 10 MG/ML IJ SOLN
INTRAMUSCULAR | Status: DC | PRN
  Administered 2016-06-04: 10 mg via INTRAVENOUS

## 2016-06-04 MED ORDER — FAMOTIDINE 20 MG PO TABS
ORAL_TABLET | ORAL | Status: AC
  Administered 2016-06-04: 20 mg via ORAL
  Filled 2016-06-04: qty 1

## 2016-06-04 MED ORDER — LIDOCAINE HCL (PF) 1 % IJ SOLN
INTRAMUSCULAR | Status: AC
  Filled 2016-06-04: qty 30

## 2016-06-04 MED ORDER — BUPIVACAINE HCL (PF) 0.25 % IJ SOLN
INTRAMUSCULAR | Status: AC
  Filled 2016-06-04: qty 30

## 2016-06-04 MED ORDER — OXYCODONE HCL 5 MG PO TABS
ORAL_TABLET | ORAL | Status: AC
  Administered 2016-06-04: 5 mg via ORAL
  Filled 2016-06-04: qty 1

## 2016-06-04 MED ORDER — MIDAZOLAM HCL 5 MG/5ML IJ SOLN
INTRAMUSCULAR | Status: AC
  Administered 2016-06-04: 1 mg via INTRAVENOUS
  Filled 2016-06-04: qty 5

## 2016-06-04 MED ORDER — EPINEPHRINE HCL 1 MG/ML IJ SOLN
INTRAMUSCULAR | Status: AC
  Filled 2016-06-04: qty 1

## 2016-06-04 MED ORDER — FENTANYL CITRATE (PF) 100 MCG/2ML IJ SOLN
INTRAMUSCULAR | Status: DC | PRN
Start: 2016-06-04 — End: 2016-06-04
  Administered 2016-06-04 (×3): 50 ug via INTRAVENOUS

## 2016-06-04 MED ORDER — FENTANYL CITRATE (PF) 100 MCG/2ML IJ SOLN: 25.0000 ug | INTRAMUSCULAR | Status: DC | PRN

## 2016-06-04 MED ORDER — FENTANYL CITRATE (PF) 100 MCG/2ML IJ SOLN
INTRAMUSCULAR | Status: AC
  Administered 2016-06-04: 50 ug via INTRAVENOUS
  Filled 2016-06-04: qty 2

## 2016-06-04 MED ORDER — PROMETHAZINE HCL 25 MG/ML IJ SOLN: 6.2500 mg | INTRAMUSCULAR | Status: DC | PRN

## 2016-06-04 MED ORDER — EPINEPHRINE HCL 1 MG/ML IJ SOLN
INTRAMUSCULAR | Status: DC | PRN
  Administered 2016-06-04: 8 mL via SUBCUTANEOUS

## 2016-06-04 MED ORDER — CHLORHEXIDINE GLUCONATE CLOTH 2 % EX PADS: 6.0000 | MEDICATED_PAD | Freq: Once | CUTANEOUS | Status: DC

## 2016-06-04 MED ORDER — MIDAZOLAM HCL 5 MG/5ML IJ SOLN
1.0000 mg | Freq: Once | INTRAMUSCULAR | Status: AC
  Administered 2016-06-04: 1 mg via INTRAVENOUS

## 2016-06-04 MED ORDER — DEXTROSE 5 % IV SOLN
3.0000 g | INTRAVENOUS | Status: AC
  Administered 2016-06-04: 3 g via INTRAVENOUS
  Filled 2016-06-04: qty 3000

## 2016-06-04 MED ORDER — ROCURONIUM BROMIDE 100 MG/10ML IV SOLN
INTRAVENOUS | Status: DC | PRN
Start: 2016-06-04 — End: 2016-06-04
  Administered 2016-06-04 (×3): 10 mg via INTRAVENOUS
  Administered 2016-06-04: 30 mg via INTRAVENOUS

## 2016-06-04 MED ORDER — LIDOCAINE HCL 1 % IJ SOLN
INTRAMUSCULAR | Status: DC | PRN
  Administered 2016-06-04: 18 mL

## 2016-06-04 MED ORDER — OXYCODONE HCL 5 MG PO TABS
5.0000 mg | ORAL_TABLET | Freq: Once | ORAL | Status: AC
  Administered 2016-06-04: 5 mg via ORAL

## 2016-06-04 MED ORDER — ONDANSETRON HCL 4 MG/2ML IJ SOLN
INTRAMUSCULAR | Status: DC | PRN
  Administered 2016-06-04: 4 mg via INTRAVENOUS

## 2016-06-04 MED ORDER — NEOMYCIN-POLYMYXIN B GU 40-200000 IR SOLN
Status: DC | PRN
  Administered 2016-06-04: 2 mL

## 2016-06-04 MED ORDER — ONDANSETRON HCL 4 MG PO TABS: 4.0000 mg | ORAL_TABLET | Freq: Three times a day (TID) | ORAL | 0 refills | Status: DC | PRN

## 2016-06-04 MED ORDER — FENTANYL CITRATE (PF) 100 MCG/2ML IJ SOLN
50.0000 ug | Freq: Once | INTRAMUSCULAR | Status: AC
  Administered 2016-06-04: 50 ug via INTRAVENOUS

## 2016-06-04 MED ORDER — PHENYLEPHRINE HCL 10 MG/ML IJ SOLN
INTRAMUSCULAR | Status: DC | PRN
  Administered 2016-06-04: 200 ug via INTRAVENOUS

## 2016-06-04 MED ORDER — ROPIVACAINE HCL 5 MG/ML IJ SOLN
INTRAMUSCULAR | Status: DC | PRN
Start: 2016-06-04 — End: 2016-06-04
  Administered 2016-06-04: 30 mL via EPIDURAL

## 2016-06-04 MED ORDER — LIDOCAINE HCL (PF) 1 % IJ SOLN
INTRAMUSCULAR | Status: AC
  Filled 2016-06-04: qty 5

## 2016-06-04 MED ORDER — NEOMYCIN-POLYMYXIN B GU 40-200000 IR SOLN
Status: AC
  Filled 2016-06-04: qty 2

## 2016-06-04 MED ORDER — ROPIVACAINE HCL 5 MG/ML IJ SOLN
INTRAMUSCULAR | Status: AC
  Filled 2016-06-04: qty 40

## 2016-06-04 MED ORDER — FAMOTIDINE 20 MG PO TABS
20.0000 mg | ORAL_TABLET | Freq: Once | ORAL | Status: AC
  Administered 2016-06-04: 20 mg via ORAL

## 2016-06-04 MED ORDER — PROPOFOL 10 MG/ML IV BOLUS
INTRAVENOUS | Status: DC | PRN
  Administered 2016-06-04: 200 mg via INTRAVENOUS

## 2016-06-04 MED ORDER — LACTATED RINGERS IV SOLN
INTRAVENOUS | Status: DC
  Administered 2016-06-04: 07:00:00 via INTRAVENOUS

## 2016-06-04 MED ORDER — LIDOCAINE HCL (PF) 1 % IJ SOLN
INTRAMUSCULAR | Status: DC | PRN
  Administered 2016-06-04 (×2): 5 mL

## 2016-06-04 MED ORDER — LACTATED RINGERS IV SOLN
INTRAVENOUS | Status: DC | PRN
  Administered 2016-06-04: 08:00:00 via INTRAVENOUS

## 2016-06-04 MED ORDER — PROPOFOL 500 MG/50ML IV EMUL
INTRAVENOUS | Status: DC | PRN
  Administered 2016-06-04: 150 ug/kg/min via INTRAVENOUS

## 2016-06-04 SURGICAL SUPPLY — 68 items
ADAPTER IRRIG TUBE 2 SPIKE SOL (ADAPTER) ×8 IMPLANT
BUR RADIUS 4.0X18.5 (BURR) ×4 IMPLANT
BUR RADIUS 5.5 (BURR) ×4 IMPLANT
CANISTER SUCT LVC 12 LTR MEDI- (MISCELLANEOUS) ×4 IMPLANT
CANNULA 5.75X7 CRYSTAL CLEAR (CANNULA) ×4 IMPLANT
CANNULA PARTIAL THREAD 2X7 (CANNULA) IMPLANT
CANNULA TWIST IN 8.25X9CM (CANNULA) IMPLANT
CLOSURE WOUND 1/2 X4 (GAUZE/BANDAGES/DRESSINGS) ×1
CONNECTOR PERFECT PASSER (CONNECTOR) IMPLANT
COOLER POLAR GLACIER W/PUMP (MISCELLANEOUS) IMPLANT
CRADLE LAMINECT ARM (MISCELLANEOUS) ×4 IMPLANT
DEVICE SUCT BLK HOLE OR FLOOR (MISCELLANEOUS) ×4 IMPLANT
DRAPE IMP U-DRAPE 54X76 (DRAPES) ×8 IMPLANT
DRAPE INCISE IOBAN 66X45 STRL (DRAPES) ×4 IMPLANT
DRAPE SHEET LG 3/4 BI-LAMINATE (DRAPES) ×4 IMPLANT
DRAPE U-SHAPE 47X51 STRL (DRAPES) ×4 IMPLANT
DRSG OPSITE POSTOP 3X4 (GAUZE/BANDAGES/DRESSINGS) ×12 IMPLANT
DURAPREP 26ML APPLICATOR (WOUND CARE) ×12 IMPLANT
ELECT REM PT RETURN 9FT ADLT (ELECTROSURGICAL) ×4
ELECTRODE REM PT RTRN 9FT ADLT (ELECTROSURGICAL) ×2 IMPLANT
GAUZE PETRO XEROFOAM 1X8 (MISCELLANEOUS) ×4 IMPLANT
GAUZE SPONGE 4X4 12PLY STRL (GAUZE/BANDAGES/DRESSINGS) ×8 IMPLANT
GLOVE BIOGEL PI IND STRL 9 (GLOVE) ×2 IMPLANT
GLOVE BIOGEL PI INDICATOR 9 (GLOVE) ×2
GLOVE SURG 9.0 ORTHO LTXF (GLOVE) ×8 IMPLANT
GOWN STRL REUS TWL 2XL XL LVL4 (GOWN DISPOSABLE) ×4 IMPLANT
GOWN STRL REUS W/ TWL LRG LVL3 (GOWN DISPOSABLE) ×2 IMPLANT
GOWN STRL REUS W/ TWL LRG LVL4 (GOWN DISPOSABLE) ×2 IMPLANT
GOWN STRL REUS W/TWL LRG LVL3 (GOWN DISPOSABLE) ×2
GOWN STRL REUS W/TWL LRG LVL4 (GOWN DISPOSABLE) ×2
IV LACTATED RINGER IRRG 3000ML (IV SOLUTION) ×16
IV LR IRRIG 3000ML ARTHROMATIC (IV SOLUTION) ×16 IMPLANT
KIT RM TURNOVER STRD PROC AR (KITS) ×4 IMPLANT
KIT STABILIZATION SHOULDER (MISCELLANEOUS) ×4 IMPLANT
KIT SUTURE 2.8 Q-FIX DISP (MISCELLANEOUS) IMPLANT
KIT SUTURETAK 3.0 INSERT PERC (KITS) IMPLANT
MANIFOLD NEPTUNE II (INSTRUMENTS) ×4 IMPLANT
MASK FACE SPIDER DISP (MASK) ×4 IMPLANT
MAT BLUE FLOOR 46X72 FLO (MISCELLANEOUS) ×8 IMPLANT
NDL SAFETY 18GX1.5 (NEEDLE) ×4 IMPLANT
NDL SAFETY 22GX1.5 (NEEDLE) ×4 IMPLANT
NS IRRIG 500ML POUR BTL (IV SOLUTION) ×4 IMPLANT
PACK ARTHROSCOPY SHOULDER (MISCELLANEOUS) ×4 IMPLANT
PAD WRAPON POLAR SHDR XLG (MISCELLANEOUS) IMPLANT
PASSER SUT CAPTURE FIRST (SUTURE) IMPLANT
SET TUBE SUCT SHAVER OUTFL 24K (TUBING) ×4 IMPLANT
SET TUBE TIP INTRA-ARTICULAR (MISCELLANEOUS) ×4 IMPLANT
STRAP SAFETY BODY (MISCELLANEOUS) ×4 IMPLANT
STRIP CLOSURE SKIN 1/2X4 (GAUZE/BANDAGES/DRESSINGS) ×3 IMPLANT
SUT ETHILON 4-0 (SUTURE) ×2
SUT ETHILON 4-0 FS2 18XMFL BLK (SUTURE) ×2
SUT LASSO 90 DEG SD STR (SUTURE) IMPLANT
SUT MNCRL 4-0 (SUTURE) ×2
SUT MNCRL 4-0 27XMFL (SUTURE) ×2
SUT PDS AB 0 CT1 27 (SUTURE) ×4 IMPLANT
SUT PERFECTPASSER WHITE CART (SUTURE) IMPLANT
SUT VIC AB 0 CT1 36 (SUTURE) ×4 IMPLANT
SUT VIC AB 2-0 CT2 27 (SUTURE) ×4 IMPLANT
SUTURE ETHLN 4-0 FS2 18XMF BLK (SUTURE) ×2 IMPLANT
SUTURE MAGNUM WIRE 2X48 BLK (SUTURE) IMPLANT
SUTURE MNCRL 4-0 27XMF (SUTURE) ×2 IMPLANT
SYRINGE 10CC LL (SYRINGE) ×4 IMPLANT
TAPE MICROFOAM 4IN (TAPE) IMPLANT
TUBING ARTHRO INFLOW-ONLY STRL (TUBING) ×4 IMPLANT
TUBING CONNECTING 10 (TUBING) ×3 IMPLANT
TUBING CONNECTING 10' (TUBING) ×1
WAND HAND CNTRL MULTIVAC 90 (MISCELLANEOUS) ×4 IMPLANT
WRAPON POLAR PAD SHDR XLG (MISCELLANEOUS)

## 2016-06-04 NOTE — Transfer of Care (Addendum)
Immediate Anesthesia Transfer of Care Note  Patient: Diamond Mcclain  Procedure(s) Performed: Procedure(s): SHOULDER ARTHROSCOPY WITH BICEPSTENOTOMY (Left) SUBACROMIAL DECOMPRESSION (Left) LYSIS OF ADHESION (Left)  Patient Location: PACU  Anesthesia Type:General  Level of Consciousness: awake and alert   Airway & Oxygen Therapy: Patient Spontanous Breathing and Patient connected to face mask oxygen  Post-op Assessment: Report given to RN and Post -op Vital signs reviewed and stable  Post vital signs: Reviewed and stable  Last Vitals:  Vitals:   06/04/16 0800 06/04/16 1000  BP: (!) 160/111 (!) 160/119  Pulse: 69 (!) 116  Resp:  16  Temp:  (!) 35.9 C    Last Pain:  Vitals:   06/04/16 1000  TempSrc: Tympanic  PainSc:       Patients Stated Pain Goal: 2 (06/04/16 0615)  Complications: No apparent anesthesia complications

## 2016-06-04 NOTE — Anesthesia Procedure Notes (Signed)
Procedure Name: Intubation Date/Time: 06/04/2016 8:11 AM Performed by: Marlana SalvageJESSUP, [place] Weekly Pre-anesthesia Checklist: Patient identified, Emergency Drugs available, Suction available and Patient being monitored Patient Re-evaluated:Patient Re-evaluated prior to inductionOxygen Delivery Method: Circle system utilized Preoxygenation: Pre-oxygenation with 100% oxygen Intubation Type: IV induction Ventilation: Mask ventilation without difficulty Laryngoscope Size: Mac and 3 Grade View: Grade II Tube type: Oral Tube size: 7.0 mm Airway Equipment and Method: Stylet Placement Confirmation: ETT inserted through vocal cords under direct vision,  positive ETCO2 and breath sounds checked- equal and bilateral Secured at: 22 cm Tube secured with: Tape Dental Injury: Teeth and Oropharynx as per pre-operative assessment

## 2016-06-04 NOTE — H&P (Signed)
The patient has been re-examined, and the chart reviewed, and there have been no interval changes to the documented history and physical.    The risks, benefits, and alternatives have been discussed at length, and the patient is willing to proceed.   

## 2016-06-04 NOTE — OR Nursing (Signed)
Exterior shoulder honeycomb dressing changed X1 per order of Dr. Martha ClanKrasinski. Dsg was saturated with ser. Sang. Drainage. Additional dressing material sent with family.

## 2016-06-04 NOTE — Anesthesia Postprocedure Evaluation (Signed)
Anesthesia Post Note  Patient: Diamond Mcclain  Procedure(s) Performed: Procedure(s) (LRB): SHOULDER ARTHROSCOPY WITH BICEPSTENOTOMY (Left) SUBACROMIAL DECOMPRESSION (Left) LYSIS OF ADHESION (Left)  Patient location during evaluation: PACU Anesthesia Type: General and Regional Level of consciousness: awake and alert Pain management: pain level controlled Vital Signs Assessment: post-procedure vital signs reviewed and stable Respiratory status: spontaneous breathing, nonlabored ventilation, respiratory function stable and patient connected to nasal cannula oxygen Cardiovascular status: blood pressure returned to baseline and stable Postop Assessment: no signs of nausea or vomiting Anesthetic complications: no    Last Vitals:  Vitals:   06/04/16 1123 06/04/16 1234  BP: (!) 150/100 (!) 156/86  Pulse: 93 98  Resp: 20 18  Temp: 36.9 C     Last Pain:  Vitals:   06/04/16 1234  TempSrc:   PainSc: 2                  Lenard SimmerAndrew Lolah Coghlan

## 2016-06-04 NOTE — Anesthesia Procedure Notes (Signed)
[facility]([id number]) [phone] GEX'BAmbrose Mcclain

## 2016-06-04 NOTE — Progress Notes (Signed)
Oxycodone given for upper chest shoulder pain  States she feels a bee in that chest area

## 2016-06-04 NOTE — Progress Notes (Signed)
Sling on  Nausea a little better with oxygen  Taking ice chips well   Coughing less

## 2016-06-04 NOTE — Anesthesia Procedure Notes (Signed)
Procedures

## 2016-06-04 NOTE — Anesthesia Preprocedure Evaluation (Signed)
Anesthesia Evaluation  Patient identified by MRN, date of birth, ID band Patient awake    Reviewed: Allergy & Precautions, H&P , NPO status , Patient's Chart, lab work & pertinent test results, reviewed documented beta blocker date and time   History of Anesthesia Complications (+) PONV and history of anesthetic complications  Airway Mallampati: III  TM Distance: >3 FB Neck ROM: full    Dental no notable dental hx. (+) Missing, Poor Dentition   Pulmonary neg shortness of breath, neg sleep apnea, neg COPD, neg recent URI, Current Smoker,    Pulmonary exam normal breath sounds clear to auscultation       Cardiovascular Exercise Tolerance: Good negative cardio ROS Normal cardiovascular exam Rhythm:regular Rate:Normal     Neuro/Psych negative neurological ROS  negative psych ROS   GI/Hepatic Neg liver ROS, GERD  ,  Endo/Other  neg diabetesMorbid obesity  Renal/GU negative Renal ROS  negative genitourinary   Musculoskeletal   Abdominal   Peds  Hematology negative hematology ROS (+)   Anesthesia Other Findings Past Medical History: No date: GERD (gastroesophageal reflux disease)     Comment: H/O 1999: Ovarian cyst No date: PONV (postoperative nausea and vomiting)   Reproductive/Obstetrics negative OB ROS                             Anesthesia Physical Anesthesia Plan  ASA: III  Anesthesia Plan: General and Regional   Post-op Pain Management: GA combined w/ Regional for post-op pain   Induction:   Airway Management Planned:   Additional Equipment:   Intra-op Plan:   Post-operative Plan:   Informed Consent: I have reviewed the patients History and Physical, chart, labs and discussed the procedure including the risks, benefits and alternatives for the proposed anesthesia with the patient or authorized representative who has indicated his/her understanding and acceptance.    Dental Advisory Given  Plan Discussed with: Anesthesiologist, CRNA and Surgeon  Anesthesia Plan Comments:         Anesthesia Quick Evaluation

## 2016-06-04 NOTE — Discharge Instructions (Signed)

## 2016-06-04 NOTE — Op Note (Signed)
06/04/2016  10:10 AM  PATIENT:  Diamond Mcclain    PRE-OPERATIVE DIAGNOSIS:  M25.512 Pain in left shoulder  POST-OPERATIVE DIAGNOSIS:  Same  PROCEDURE:  LEFT SHOULDER ARTHROSCOPIC BICEPS TENOTOMY, SUBACROMIAL DECOMPRESSION, LYSIS OF ADHESION  SURGEON:  Thornton Park, MD  ANESTHESIA:   General  PREOPERATIVE INDICATIONS:  Diamond Mcclain is a  40 y.o. female with a diagnosis of M25.512 Pain in left shoulder who failed conservative treatment including physical therapy and corticosteroid injection and any NSAIDs. Patient elected for arthroscopic evaluation left shoulder with possible biceps tenotomy, subacromial decompression and possible revision rotator cuff repair..    I discussed the risks and benefits of surgery. The risks include but are not limited to infection, bleeding requiring blood transfusion, nerve or blood vessel injury, joint stiffness or loss of motion, persistent pain, weakness or instability, malunion, nonunion and hardware failure and the need for further surgery. Medical risks include but are not limited to DVT and pulmonary embolism, myocardial infarction, stroke, pneumonia, respiratory failure and death. Patient understood these risks and wished to proceed.   OPERATIVE FINDINGS: The left rotator cuff repair was intact. The intertransverse portion biceps tendon was hyperemic and inflamed.  Patient had scarring and adhesions around the subscapularis. Patient had subacromial impingement with bursitis.  OPERATIVE PROCEDURE: The patient was met in the preoperative area. The left shoulder was signed with the word yes and my initials according the hospital's correct site of surgery protocol.  History and physical was updated. She underwent interscalene block in the preoperative area and general anesthesia in the operating room. The patient was placed in a beachchair position.  A spider arm positioner was used for this case. Examination under anesthesia revealed no significant  limitation of motion or instability with load shift testing. The patient had a negative sulcus sign.  Patient was prepped and draped in a sterile fashion. A timeout was performed to verify the patient's name, date of birth, medical record number, correct site of surgery and correct procedure to be performed there was also used to verify the patient received antibiotics that all appropriate instruments, implants and radiographs studies were available in the room. Once all in attendance were in agreement case began.  Bony landmarks were drawn out with a surgical marker along with proposed arthroscopy incisions. These were pre-injected with 1% lidocaine plain. An 11 blade was used to establish a posterior portal through which the arthroscope was placed in the glenohumeral joint. A full diagnostic examination of the shoulder was performed. The previous rotator cuff repair was carefully examined and demonstrated no articular sided tear.  An 18-gauge spinal needle was used to place a 0 PDS suture through the supraspinatus for identification from the bursal side.    The subscapularis was found to have scarring and adhesions around it which may be contributing to the patient's anterolateral shoulder pain. I formed a lysis of adhesions using a 90 ArthroCare wand. The subscapularis tendon had excellent mobility. A biceps tenotomy was also performed using an arthroscopic scissor. Intertransverse portion of the biceps tendon was hyperemic and inflamed and also likely contributing factor to the patient's pain.  The arthroscope was then placed in the subacromial space.  Bursitis was encountered. A lateral portal was established with an 18-gauge spinal needle for localization. A 90 ArthroCare wand and 40 resector shaver blade were used to perform a subacromial bursectomy. The 0 PDS suture placed through the rotator cuff was identified from the bursal side. The rotator cuff was inspected carefully and there  was no  evidence of a bursal sided tear of the supra or infraspinatus.   A subacromial decompression was performed using a 5.5 mm resector shaver blade from the lateral portal. Subacromial space was then copiously irrigated to remove all osseous debris. Final arthroscopic images were taken. Arthroscopic images were then removed.  Skin closure for the arthroscopic incisions was performed with 4-0 nylon.   0.25% Marcaine plain was then injected into the subacromial space for postoperative pain control. A dry sterile dressing was applied.  The patient was placed in a sling.  All sharp and it instrument counts were correct at the conclusion of the case. I was scrubbed and present for the entire case. I spoke with the patient's family postoperatively to let them know the case had been performed without complication and the patient was stable in recovery room.

## 2016-06-06 ENCOUNTER — Encounter: Payer: Self-pay | Admitting: Orthopedic Surgery

## 2016-09-29 ENCOUNTER — Emergency Department
Admission: EM | Admit: 2016-09-29 | Discharge: 2016-09-29 | Disposition: A | Discharge status: Home / Self Care | Payer: Self-pay | Attending: Student in an Organized Health Care Education/Training Program | Admitting: Student in an Organized Health Care Education/Training Program

## 2016-09-29 DIAGNOSIS — L03312 Cellulitis of back [any part except buttock]: Secondary | ICD-10-CM | POA: Insufficient documentation

## 2016-09-29 DIAGNOSIS — Z791 Long term (current) use of non-steroidal anti-inflammatories (NSAID): Secondary | ICD-10-CM | POA: Insufficient documentation

## 2016-09-29 DIAGNOSIS — Z79899 Other long term (current) drug therapy: Secondary | ICD-10-CM | POA: Insufficient documentation

## 2016-09-29 DIAGNOSIS — F17210 Nicotine dependence, cigarettes, uncomplicated: Secondary | ICD-10-CM | POA: Insufficient documentation

## 2016-09-29 MED ORDER — TRAMADOL HCL 50 MG PO TABS: 50.0000 mg | ORAL_TABLET | Freq: Four times a day (QID) | ORAL | 0 refills | Status: DC | PRN

## 2016-09-29 MED ORDER — CEPHALEXIN 500 MG PO CAPS: 500.0000 mg | ORAL_CAPSULE | Freq: Four times a day (QID) | ORAL | 0 refills | Status: DC

## 2016-09-29 NOTE — ED Provider Notes (Signed)
[facility] Emergency Department Provider Note  ____________________________________________  Time seen: Approximately 7:23 PM  I have reviewed the triage vital signs and the nursing notes.   HISTORY  Chief Complaint Abscess    HPI Diamond Mcclain is a 41 y.o. female who presents emergency department complaining of a "boil" to her right posterior shoulder. She reports symptoms have been in placefor 4 days. She denies any drainage from site. She reports pain to the area with palpation. No fevers or chills, nausea or vomiting, abdominal pain. No medications for this complaint prior to arrival.   Past Medical History:  Diagnosis Date  . GERD (gastroesophageal reflux disease)    H/O  . Ovarian cyst 1999  . PONV (postoperative nausea and vomiting)     Patient Active Problem List   Diagnosis Date Noted  . S/P rotator cuff repair 07/19/2015    Past Surgical History:  Procedure Laterality Date  . CHOLECYSTECTOMY    . KNEE SURGERY    . LYSIS OF ADHESION Left 06/04/2016   Procedure: LYSIS OF ADHESION;  Surgeon: Juanell FairlyKevin Krasinski, MD;  Location: [facility] ORS;  Service: Orthopedics;  Laterality: Left;  . OVARY SURGERY Left 1998  . SHOULDER ARTHROSCOPY WITH BICEPSTENOTOMY Left 06/04/2016   Procedure: SHOULDER ARTHROSCOPY WITH BICEPSTENOTOMY;  Surgeon: Juanell FairlyKevin Krasinski, MD;  Location: [facility] ORS;  Service: Orthopedics;  Laterality: Left;  . SHOULDER ARTHROSCOPY WITH OPEN ROTATOR CUFF REPAIR Left 07/19/2015   Procedure: SHOULDER ARTHROSCOPY WITH OPEN ROTATOR CUFF REPAIR;  Surgeon: Juanell FairlyKevin Krasinski, MD;  Location: [facility] ORS;  Service: Orthopedics;  Laterality: Left;  . SUBACROMIAL DECOMPRESSION Left 06/04/2016   Procedure: SUBACROMIAL DECOMPRESSION;  Surgeon: Juanell FairlyKevin Krasinski, MD;  Location: [facility] ORS;  Service: Orthopedics;  Laterality: Left;    Prior to Admission medications   Medication Sig Start Date End Date Taking? Authorizing Provider  cephALEXin (KEFLEX) 500 MG capsule  Take 1 capsule (500 mg total) by mouth 4 (four) times daily. 09/29/16   Delorise RoyalsJonathan D Chrishonda Hesch, PA-C  ibuprofen (ADVIL,MOTRIN) 800 MG tablet Take 1 tablet (800 mg total) by mouth every 8 (eight) hours as needed. 02/04/15   Charmayne Sheerharles M Beers, PA-C  ondansetron (ZOFRAN) 4 MG tablet Take 1 tablet (4 mg total) by mouth every 8 (eight) hours as needed for nausea or vomiting. 06/04/16   Juanell FairlyKevin Krasinski, MD  oxyCODONE (OXY IR/ROXICODONE) 5 MG immediate release tablet Take 1-2 tablets (5-10 mg total) by mouth every 4 (four) hours as needed for severe pain. 06/04/16   Juanell FairlyKevin Krasinski, MD  traMADol (ULTRAM) 50 MG tablet Take 1 tablet (50 mg total) by mouth every 6 (six) hours as needed. 09/29/16   Delorise RoyalsJonathan D Dontrae Morini, PA-C    Allergies Sulfa antibiotics and Tape  No family history on file.  Social History Social History  Substance Use Topics  . Smoking status: Current Every Day Smoker    Packs/day: 0.50    Years: 15.00    Types: Cigarettes  . Smokeless tobacco: Never Used  . Alcohol use No     Review of Systems  Constitutional: No fever/chills Cardiovascular: no chest pain. Respiratory: no cough. No SOB. Musculoskeletal: Negative for musculoskeletal pain. Skin: Nausea for "boil" to the right posterior shoulder blade Neurological: Negative for headaches, focal weakness or numbness. 10-point ROS otherwise negative.  ____________________________________________   PHYSICAL EXAM:  VITAL SIGNS: ED Triage Vitals  Enc Vitals Group     BP 09/29/16 1659 (!) 141/91     Pulse Rate 09/29/16 1659 (!) 101  Resp 09/29/16 1659 16     Temp 09/29/16 1659 98.6 F (37 C)     Temp Source 09/29/16 1659 Oral     SpO2 09/29/16 1659 99 %     Weight 09/29/16 1700 285 lb (129.3 kg)     Height 09/29/16 1700 5\' 2"  (1.575 m)     Head Circumference --      Peak Flow --      Pain Score 09/29/16 1700 8     Pain Loc --      Pain Edu? --      Excl. in GC? --      Constitutional: Alert and oriented. Well  appearing and in no acute distress. Eyes: Conjunctivae are normal. PERRL. EOMI. Head: Atraumatic. Neck: No stridor.   Hematological/Lymphatic/Immunilogical: No cervical lymphadenopathy. Cardiovascular: Normal rate, regular rhythm. Normal S1 and S2.  Good peripheral circulation. Respiratory: Normal respiratory effort without tachypnea or retractions. Lungs CTAB. Good air entry to the bases with no decreased or absent breath sounds. Musculoskeletal: Full range of motion to all extremities. No gross deformities appreciated. Neurologic:  Normal speech and language. No gross focal neurologic deficits are appreciated.  Skin:  Skin is warm, dry and intact. No rash noted. Tenderness and edematous skin lesion is noted to the right posterior shoulder. Area is firm to palpation. No induration or fluctuance. No drainage noted. Area measures approximately 5 cm in diameter. Psychiatric: Mood and affect are normal. Speech and behavior are normal. Patient exhibits appropriate insight and judgement.   ____________________________________________   LABS (all labs ordered are listed, but only abnormal results are displayed)  Labs Reviewed - No data to display ____________________________________________  EKG   ____________________________________________  RADIOLOGY   No results found.  ____________________________________________    PROCEDURES  Procedure(s) performed:    Procedures    Medications - No data to display   ____________________________________________   INITIAL IMPRESSION / ASSESSMENT AND PLAN / ED COURSE  Pertinent labs & imaging results that were available during my care of the patient were reviewed by me and considered in my medical decision making (see chart for details).  Review of the [place] CSRS was performed in accordance of the [facility] prior to dispensing any controlled drugs.     Patient's diagnosis is consistent with cellulitis to the right shoulder. No  indication for an abscess. No indication at this time for labs or imaging.. Patient will be discharged home with prescriptions for Keflex and a limited pain medication. Patient is to follow up with primary care as needed or otherwise directed. Patient is given ED precautions to return to the ED for any worsening or new symptoms.     ____________________________________________  FINAL CLINICAL IMPRESSION(S) / ED DIAGNOSES  Final diagnoses:  Cellulitis of back except buttock      NEW MEDICATIONS STARTED DURING THIS VISIT:  New Prescriptions   CEPHALEXIN (KEFLEX) 500 MG CAPSULE    Take 1 capsule (500 mg total) by mouth 4 (four) times daily.   TRAMADOL (ULTRAM) 50 MG TABLET    Take 1 tablet (50 mg total) by mouth every 6 (six) hours as needed.        This chart was dictated using voice recognition software/Dragon. Despite best efforts to proofread, errors can occur which can change the meaning. Any change was purely unintentional.    Racheal Patches, PA-C 09/29/16 1930    Willy Eddy, MD 09/29/16 [phone]

## 2016-09-29 NOTE — ED Triage Notes (Signed)
Pt reports that she has an abscess on right shoulder blade since Thursday - area is painful - denies any drainage

## 2016-10-07 ENCOUNTER — Emergency Department
Admission: EM | Admit: 2016-10-07 | Discharge: 2016-10-07 | Disposition: A | Discharge status: Home / Self Care | Payer: Self-pay | Attending: Emergency Medicine | Admitting: Emergency Medicine

## 2016-10-07 DIAGNOSIS — L02212 Cutaneous abscess of back [any part, except buttock]: Secondary | ICD-10-CM | POA: Insufficient documentation

## 2016-10-07 DIAGNOSIS — L0291 Cutaneous abscess, unspecified: Secondary | ICD-10-CM

## 2016-10-07 DIAGNOSIS — F17210 Nicotine dependence, cigarettes, uncomplicated: Secondary | ICD-10-CM | POA: Insufficient documentation

## 2016-10-07 DIAGNOSIS — Z79899 Other long term (current) drug therapy: Secondary | ICD-10-CM | POA: Insufficient documentation

## 2016-10-07 MED ORDER — CLINDAMYCIN HCL 300 MG PO CAPS: 300.0000 mg | ORAL_CAPSULE | Freq: Three times a day (TID) | ORAL | 0 refills | Status: AC

## 2016-10-07 MED ORDER — LIDOCAINE 5 % EX PTCH: 1.0000 | MEDICATED_PATCH | CUTANEOUS | 0 refills | Status: DC

## 2016-10-07 NOTE — ED Triage Notes (Signed)
Pt was here a week ago and had abscess on right shoulder - she was placed on Keflex - now the area is more painful and has started draining

## 2016-10-07 NOTE — ED Notes (Signed)
Pt discharged to home.  Discharge instructions reviewed.  Verbalized understanding.  No questions or concerns at this time.  Teach back verified.  Pt in NAD.  No items left in ED.   

## 2016-10-07 NOTE — ED Provider Notes (Signed)
[facility] Emergency Department Provider Note  ____________________________________________  Time seen: Approximately 11:18 PM  I have reviewed the triage vital signs and the nursing notes.   HISTORY  Chief Complaint Abscess    HPI Diamond Mcclain is a 41 y.o. female that presents emergency Department with "boil" on back for one week. Patient states that she was seen in the emergency department for boil and was told that he could not be drained at this time. Patient was put on Keflex. Patient states that it continued to get bigger and last night popped and began draining. Patient only has 2 days left of her Keflex. Patient is allergic to sulfa antibiotics. Patient has never had one of these before. Patient denies fever, shortness breath, chest pain, nausea, vomiting, abdominal pain.   Past Medical History:  Diagnosis Date  . GERD (gastroesophageal reflux disease)    H/O  . Ovarian cyst 1999  . PONV (postoperative nausea and vomiting)     Patient Active Problem List   Diagnosis Date Noted  . S/P rotator cuff repair 07/19/2015    Past Surgical History:  Procedure Laterality Date  . CHOLECYSTECTOMY    . KNEE SURGERY    . LYSIS OF ADHESION Left 06/04/2016   Procedure: LYSIS OF ADHESION;  Surgeon: Juanell Fairly, MD;  Location: [facility] ORS;  Service: Orthopedics;  Laterality: Left;  . OVARY SURGERY Left 1998  . SHOULDER ARTHROSCOPY WITH BICEPSTENOTOMY Left 06/04/2016   Procedure: SHOULDER ARTHROSCOPY WITH BICEPSTENOTOMY;  Surgeon: Juanell Fairly, MD;  Location: [facility] ORS;  Service: Orthopedics;  Laterality: Left;  . SHOULDER ARTHROSCOPY WITH OPEN ROTATOR CUFF REPAIR Left 07/19/2015   Procedure: SHOULDER ARTHROSCOPY WITH OPEN ROTATOR CUFF REPAIR;  Surgeon: Juanell Fairly, MD;  Location: [facility] ORS;  Service: Orthopedics;  Laterality: Left;  . SUBACROMIAL DECOMPRESSION Left 06/04/2016   Procedure: SUBACROMIAL DECOMPRESSION;  Surgeon: Juanell Fairly, MD;   Location: [facility] ORS;  Service: Orthopedics;  Laterality: Left;    Prior to Admission medications   Medication Sig Start Date End Date Taking? Authorizing Provider  cephALEXin (KEFLEX) 500 MG capsule Take 1 capsule (500 mg total) by mouth 4 (four) times daily. 09/29/16   Delorise Royals Cuthriell, PA-C  clindamycin (CLEOCIN) 300 MG capsule Take 1 capsule (300 mg total) by mouth 3 (three) times daily. 10/07/16 10/17/16  Enid Derry, PA-C  ibuprofen (ADVIL,MOTRIN) 800 MG tablet Take 1 tablet (800 mg total) by mouth every 8 (eight) hours as needed. 02/04/15   Charmayne Sheer Beers, PA-C  lidocaine (LIDODERM) 5 % Place 1 patch onto the skin daily. Remove & Discard patch within 12 hours or as directed by MD 10/07/16   Enid Derry, PA-C  ondansetron (ZOFRAN) 4 MG tablet Take 1 tablet (4 mg total) by mouth every 8 (eight) hours as needed for nausea or vomiting. 06/04/16   Juanell Fairly, MD  oxyCODONE (OXY IR/ROXICODONE) 5 MG immediate release tablet Take 1-2 tablets (5-10 mg total) by mouth every 4 (four) hours as needed for severe pain. 06/04/16   Juanell Fairly, MD  traMADol (ULTRAM) 50 MG tablet Take 1 tablet (50 mg total) by mouth every 6 (six) hours as needed. 09/29/16   Delorise Royals Cuthriell, PA-C    Allergies Sulfa antibiotics and Tape  No family history on file.  Social History Social History  Substance Use Topics  . Smoking status: Current Every Day Smoker    Packs/day: 0.50    Years: 15.00    Types: Cigarettes  . Smokeless tobacco: Never Used  .  Alcohol use No     Review of Systems  Constitutional: No fever ENT: No upper respiratory complaints. Cardiovascular: No chest pain. Respiratory: No cough. No SOB. Gastrointestinal: No abdominal pain.  No nausea, no vomiting.  Musculoskeletal: Negative for musculoskeletal pain. Skin: Negative for abrasions, lacerations, ecchymosis. Neurological: Negative for headaches, numbness or tingling   ____________________________________________   PHYSICAL  EXAM:  VITAL SIGNS: ED Triage Vitals [10/07/16 2012]  Enc Vitals Group     BP (!) 143/71     Pulse Rate 87     Resp 16     Temp 98.4 F (36.9 C)     Temp Source Oral     SpO2 100 %     Weight 285 lb (129.3 kg)     Height 5\' 2"  (1.575 m)     Head Circumference      Peak Flow      Pain Score 5     Pain Loc      Pain Edu?      Excl. in GC?      Constitutional: Alert and oriented. Well appearing and in no acute distress. Eyes: Conjunctivae are normal. PERRL. EOMI. Head: Atraumatic. ENT:      Ears:      Nose: No congestion/rhinnorhea.      Mouth/Throat: Mucous membranes are moist.  Neck: No stridor.   Cardiovascular: Normal rate, regular rhythm.  Good peripheral circulation. Respiratory: Normal respiratory effort without tachypnea or retractions. Lungs CTAB. Good air entry to the bases with no decreased or absent breath sounds. Musculoskeletal: Full range of motion to all extremities. No gross deformities appreciated. Neurologic:  Normal speech and language. No gross focal neurologic deficits are appreciated.  Skin:  Skin is warm, dry and intact. 2 cm x 2 cm area of fluctuance on upper right back. Purulent drainage noted. Mildly tender to palpation. Psychiatric: Mood and affect are normal. Speech and behavior are normal. Patient exhibits appropriate insight and judgement.   ____________________________________________   LABS (all labs ordered are listed, but only abnormal results are displayed)  Labs Reviewed - No data to display ____________________________________________  EKG   ____________________________________________  RADIOLOGY   No results found.  ____________________________________________    PROCEDURES  Procedure(s) performed:    Procedures    Medications - No data to display   ____________________________________________   INITIAL IMPRESSION / ASSESSMENT AND PLAN / ED COURSE  Pertinent labs & imaging results that were available  during my care of the patient were reviewed by me and considered in my medical decision making (see chart for details).  Review of the [place] CSRS was performed in accordance of the [facility] prior to dispensing any controlled drugs.     Patient's diagnosis is consistent with abscess. Vital signs and exam are reassuring. Abscesses that is actively draining in ED. Abscesses was palpated to drain all of fluid. Patient is almost done with antibiotics. Another antibiotic will be added onto current regimen. Patient is allergic to Bactrim. Patient will be discharged home with prescriptions for clindamycin. Patient is to follow up with PCP as directed. Patient is given ED precautions to return to the ED for any worsening or new symptoms.     ____________________________________________  FINAL CLINICAL IMPRESSION(S) / ED DIAGNOSES  Final diagnoses:  Abscess      NEW MEDICATIONS STARTED DURING THIS VISIT:  Discharge Medication List as of 10/07/2016 10:46 PM    START taking these medications   Details  clindamycin (CLEOCIN) 300 MG capsule Take 1 capsule (  300 mg total) by mouth 3 (three) times daily., Starting Mon 10/07/2016, Until Thu 10/17/2016, Print    lidocaine (LIDODERM) 5 % Place 1 patch onto the skin daily. Remove & Discard patch within 12 hours or as directed by MD, Starting Mon 10/07/2016, Print            This chart was dictated using voice recognition software/Dragon. Despite best efforts to proofread, errors can occur which can change the meaning. Any change was purely unintentional.    Enid Derry, PA-C 10/07/16 2322    Arnaldo Natal, MD 10/07/16 [phone]

## 2016-10-21 ENCOUNTER — Encounter: Payer: Self-pay | Admitting: *Deleted

## 2016-10-21 ENCOUNTER — Emergency Department
Admission: EM | Admit: 2016-10-21 | Discharge: 2016-10-21 | Disposition: A | Discharge status: Home / Self Care | Payer: Self-pay | Attending: Emergency Medicine | Admitting: Emergency Medicine

## 2016-10-21 DIAGNOSIS — L0291 Cutaneous abscess, unspecified: Secondary | ICD-10-CM

## 2016-10-21 DIAGNOSIS — F17210 Nicotine dependence, cigarettes, uncomplicated: Secondary | ICD-10-CM | POA: Insufficient documentation

## 2016-10-21 DIAGNOSIS — L02212 Cutaneous abscess of back [any part, except buttock]: Secondary | ICD-10-CM | POA: Insufficient documentation

## 2016-10-21 MED ORDER — CLINDAMYCIN HCL 300 MG PO CAPS: 300.0000 mg | ORAL_CAPSULE | Freq: Three times a day (TID) | ORAL | 0 refills | Status: AC

## 2016-10-21 MED ORDER — LIDOCAINE-EPINEPHRINE-TETRACAINE (LET) SOLUTION
NASAL | Status: AC
  Administered 2016-10-21: 3 mL via TOPICAL
  Filled 2016-10-21: qty 3

## 2016-10-21 MED ORDER — LIDOCAINE-EPINEPHRINE-TETRACAINE (LET) SOLUTION
3.0000 mL | Freq: Once | NASAL | Status: AC
  Administered 2016-10-21: 3 mL via TOPICAL
  Filled 2016-10-21: qty 3

## 2016-10-21 NOTE — ED Notes (Signed)
Pt states abcess was drained 2 weeks ago. Hurting worse now and sinuses are hurting "hope it's not the flu, I feel like shit" pt states fatigue, cough and sinus pressure

## 2016-10-21 NOTE — ED Triage Notes (Signed)
States an abscess on her back for several weeks, states she has taken abx but with no relief

## 2016-10-21 NOTE — ED Notes (Signed)
Has had a small area to back for about 1 month.  States she has been on antibiotic times 2   But area is painful and red  No drainage noted at present

## 2016-10-21 NOTE — ED Provider Notes (Signed)
[facility] Emergency Department Provider Note  ____________________________________________  Time seen: Approximately 8:09 PM  I have reviewed the triage vital signs and the nursing notes.   HISTORY  Chief Complaint Abscess    HPI Diamond Mcclain is a 41 y.o. female presenting to the emergency department with a back abscess that first became apparent approximately six weeks ago. Patient was [facility] at [facility] ED on 10/07/2016 with the same spontaneously draining abscess. Because abscess was spontaneously draining, incision and drainage what not performed at aforementioned ED encounter. Patient was discharged on clindamycin. Patient does not recall starting clindamycin. She is under the impression that she was being treated with Bactrim despite noted sulfa allergy. Patient states that back abscess initially started to heal. However, abscess has since enlarged with black scabbing. Patient has been afebrile.  Past Medical History:  Diagnosis Date  . GERD (gastroesophageal reflux disease)    H/O  . Ovarian cyst 1999  . PONV (postoperative nausea and vomiting)     Patient Active Problem List   Diagnosis Date Noted  . S/P rotator cuff repair 07/19/2015    Past Surgical History:  Procedure Laterality Date  . CHOLECYSTECTOMY    . KNEE SURGERY    . LYSIS OF ADHESION Left 06/04/2016   Procedure: LYSIS OF ADHESION;  Surgeon: Juanell FairlyKevin Krasinski, MD;  Location: [facility] ORS;  Service: Orthopedics;  Laterality: Left;  . OVARY SURGERY Left 1998  . SHOULDER ARTHROSCOPY WITH BICEPSTENOTOMY Left 06/04/2016   Procedure: SHOULDER ARTHROSCOPY WITH BICEPSTENOTOMY;  Surgeon: Juanell FairlyKevin Krasinski, MD;  Location: [facility] ORS;  Service: Orthopedics;  Laterality: Left;  . SHOULDER ARTHROSCOPY WITH OPEN ROTATOR CUFF REPAIR Left 07/19/2015   Procedure: SHOULDER ARTHROSCOPY WITH OPEN ROTATOR CUFF REPAIR;  Surgeon: Juanell FairlyKevin Krasinski, MD;  Location: [facility] ORS;  Service: Orthopedics;  Laterality: Left;  .  SUBACROMIAL DECOMPRESSION Left 06/04/2016   Procedure: SUBACROMIAL DECOMPRESSION;  Surgeon: Juanell FairlyKevin Krasinski, MD;  Location: [facility] ORS;  Service: Orthopedics;  Laterality: Left;    Prior to Admission medications   Medication Sig Start Date End Date Taking? Authorizing Provider  cephALEXin (KEFLEX) 500 MG capsule Take 1 capsule (500 mg total) by mouth 4 (four) times daily. 09/29/16   Delorise RoyalsJonathan D Cuthriell, PA-C  clindamycin (CLEOCIN) 300 MG capsule Take 1 capsule (300 mg total) by mouth 3 (three) times daily. 10/21/16 10/31/16  Orvil FeilJaclyn M Woods, PA-C  ibuprofen (ADVIL,MOTRIN) 800 MG tablet Take 1 tablet (800 mg total) by mouth every 8 (eight) hours as needed. 02/04/15   Charmayne Sheerharles M Beers, PA-C  lidocaine (LIDODERM) 5 % Place 1 patch onto the skin daily. Remove & Discard patch within 12 hours or as directed by MD 10/07/16   Enid DerryAshley Wagner, PA-C  ondansetron (ZOFRAN) 4 MG tablet Take 1 tablet (4 mg total) by mouth every 8 (eight) hours as needed for nausea or vomiting. 06/04/16   Juanell FairlyKevin Krasinski, MD  oxyCODONE (OXY IR/ROXICODONE) 5 MG immediate release tablet Take 1-2 tablets (5-10 mg total) by mouth every 4 (four) hours as needed for severe pain. 06/04/16   Juanell FairlyKevin Krasinski, MD  traMADol (ULTRAM) 50 MG tablet Take 1 tablet (50 mg total) by mouth every 6 (six) hours as needed. 09/29/16   Delorise RoyalsJonathan D Cuthriell, PA-C    Allergies Sulfa antibiotics and Tape  History reviewed. No pertinent family history.  Social History Social History  Substance Use Topics  . Smoking status: Current Every Day Smoker    Packs/day: 0.50    Years: 15.00    Types: Cigarettes  .  Smokeless tobacco: Never Used  . Alcohol use No     Review of Systems  Constitutional: No fever/chills Eyes: No visual changes. No discharge ENT: No upper respiratory complaints. Cardiovascular: no chest pain. Respiratory: no cough. No SOB. Gastrointestinal: No abdominal pain. No nausea, no vomiting. No diarrhea.  No constipation. Genitourinary:  Negative for dysuria. No hematuria Musculoskeletal: Negative for musculoskeletal pain. Skin: Patient has back abscess.  Neurological: Negative for headaches, focal weakness or numbness. ____________________________________________   PHYSICAL EXAM:  VITAL SIGNS: ED Triage Vitals  Enc Vitals Group     BP 10/21/16 1655 (!) 165/80     Pulse Rate 10/21/16 1655 92     Resp 10/21/16 1655 18     Temp 10/21/16 1655 100 F (37.8 C)     Temp Source 10/21/16 1655 Oral     SpO2 10/21/16 1655 99 %     Weight 10/21/16 1656 285 lb (129.3 kg)     Height 10/21/16 1656 5\' 2"  (1.575 m)     Head Circumference --      Peak Flow --      Pain Score 10/21/16 1656 7     Pain Loc --      Pain Edu? --      Excl. in GC? --      Constitutional: Alert and oriented. Well appearing and in no acute distress. Neck: FROM.  Hematological/Lymphatic/Immunilogical: No cervical lymphadenopathy. Cardiovascular: Normal rate, regular rhythm. Normal S1 and S2.  Good peripheral circulation. Respiratory: Normal respiratory effort without tachypnea or retractions. Lungs CTAB. Good air entry to the bases with no decreased or absent breath sounds. Gastrointestinal: Bowel sounds 4 quadrants. Soft and nontender to palpation. No guarding or rigidity. No palpable masses. No distention. No CVA tenderness. Musculoskeletal: Full range of motion to all extremities. No gross deformities appreciated. Neurologic:  Normal speech and language. No gross focal neurologic deficits are appreciated.  Skin: Patient has a 2 cm x 2 cm fluctuant abscess of the skin overlying the back. No streaking or surrounding cellulitis was visualized. No induration was palpated. Black scab formation was visualized. Psychiatric: Mood and affect are normal. Speech and behavior are normal. Patient exhibits appropriate insight and judgement.   ____________________________________________   LABS (all labs ordered are listed, but only abnormal results are  displayed)  Labs Reviewed - No data to display ____________________________________________  EKG   ____________________________________________  RADIOLOGY   No results found.  ____________________________________________    PROCEDURES  Procedure(s) performed:    Procedures  INCISION AND DRAINAGE Performed by: Orvil Feil Consent: Verbal consent obtained. Risks and benefits: risks, benefits and alternatives were discussed Type: abscess  Body area: Back   Anesthesia: local infiltration  Incision was made with a scalpel.  Local anesthetic: LET  Anesthetic total: 3 ml  Complexity: complex Blunt dissection to break up loculations  Drainage: purulent  Drainage amount: 5 cc  Packing material: 1/4 in iodoform gauze  Patient tolerance: Patient tolerated the procedure well with no immediate complications.     Medications  lidocaine-EPINEPHrine-tetracaine (LET) solution (3 mLs Topical Given 10/21/16 1929)     ____________________________________________   INITIAL IMPRESSION / ASSESSMENT AND PLAN / ED COURSE  Pertinent labs & imaging results that were available during my care of the patient were reviewed by me and considered in my medical decision making (see chart for details).  Review of the [place] CSRS was performed in accordance of the [facility] prior to dispensing any controlled drugs.     Assessment and  Plan Back Abscess  Patient presents to the emergency department with a back abscess. Patient underwent incision and drainage in the emergency department. While abscess appeared superficial, 5 mL of purulent material was expressed. I suspect that patient was not successfully started on clindamycin after prior emergency department visit. Patient was prescribed clindamycin at discharge for this emergency department encounter. Vital signs are reassuring at this time aside from hypertension. Patient was advised to follow-up with her primary care in two days for  packing removal. All patient questions were answered.  ____________________________________________  FINAL CLINICAL IMPRESSION(S) / ED DIAGNOSES  Final diagnoses:  Abscess      NEW MEDICATIONS STARTED DURING THIS VISIT:  New Prescriptions   CLINDAMYCIN (CLEOCIN) 300 MG CAPSULE    Take 1 capsule (300 mg total) by mouth 3 (three) times daily.        This chart was dictated using voice recognition software/Dragon. Despite best efforts to proofread, errors can occur which can change the meaning. Any change was purely unintentional.    Orvil Feil, PA-C 10/22/16 0006    Arnaldo Natal, MD 10/22/16 [phone]

## 2019-03-19 ENCOUNTER — Other Ambulatory Visit: Payer: Self-pay | Admitting: Chiropractor

## 2019-03-19 ENCOUNTER — Ambulatory Visit
Admission: RE | Admit: 2019-03-19 | Discharge: 2019-03-19 | Disposition: A | Discharge status: Home / Self Care | Payer: No Typology Code available for payment source | Attending: Chiropractor | Admitting: Chiropractor

## 2019-03-19 ENCOUNTER — Ambulatory Visit
Admission: RE | Admit: 2019-03-19 | Discharge: 2019-03-19 | Disposition: A | Discharge status: Home / Self Care | Payer: No Typology Code available for payment source | Source: Ambulatory Visit | Attending: Chiropractor | Admitting: Chiropractor

## 2019-03-19 DIAGNOSIS — M47816 Spondylosis without myelopathy or radiculopathy, lumbar region: Secondary | ICD-10-CM | POA: Diagnosis not present

## 2019-03-19 DIAGNOSIS — M47812 Spondylosis without myelopathy or radiculopathy, cervical region: Secondary | ICD-10-CM | POA: Insufficient documentation

## 2019-12-10 DIAGNOSIS — M1712 Unilateral primary osteoarthritis, left knee: Secondary | ICD-10-CM | POA: Insufficient documentation

## 2020-01-12 DIAGNOSIS — M25562 Pain in left knee: Secondary | ICD-10-CM | POA: Insufficient documentation

## 2020-01-12 DIAGNOSIS — M25662 Stiffness of left knee, not elsewhere classified: Secondary | ICD-10-CM | POA: Insufficient documentation

## 2020-03-03 ENCOUNTER — Other Ambulatory Visit: Payer: Self-pay | Admitting: Orthopedic Surgery

## 2020-03-03 DIAGNOSIS — M7122 Synovial cyst of popliteal space [Baker], left knee: Secondary | ICD-10-CM

## 2020-03-09 ENCOUNTER — Other Ambulatory Visit: Payer: Self-pay

## 2020-03-09 ENCOUNTER — Ambulatory Visit
Admission: RE | Admit: 2020-03-09 | Discharge: 2020-03-09 | Disposition: A | Discharge status: Home / Self Care | Payer: Self-pay | Source: Ambulatory Visit | Attending: Orthopedic Surgery | Admitting: Orthopedic Surgery

## 2020-03-09 DIAGNOSIS — M7122 Synovial cyst of popliteal space [Baker], left knee: Secondary | ICD-10-CM | POA: Insufficient documentation

## 2020-03-09 MED ORDER — BUPIVACAINE HCL (PF) 0.25 % IJ SOLN
INTRAMUSCULAR | Status: AC
  Filled 2020-03-09: qty 30

## 2020-03-09 MED ORDER — METHYLPREDNISOLONE ACETATE 80 MG/ML IJ SUSP
INTRAMUSCULAR | Status: AC
  Filled 2020-03-09: qty 1

## 2020-03-09 NOTE — Discharge Instructions (Signed)
Baker Cyst ° °A Baker cyst, also called a popliteal cyst, is a growth that forms at the back of the knee. The cyst forms when the fluid-filled sac (bursa) that cushions the knee joint becomes enlarged. °What are the causes? °In most cases, a Baker cyst results from another knee problem that causes swelling inside the knee. This makes the fluid inside the knee joint (synovial fluid) flow into the bursa behind the knee, causing the bursa to enlarge. °What increases the risk? °You may be more likely to develop a Baker cyst if you already have a knee problem, such as: °· A tear in cartilage that cushions the knee joint (meniscal tear). °· A tear in the tissues that connect the bones of the knee joint (ligament tear). °· Knee swelling from osteoarthritis, rheumatoid arthritis, or gout. °What are the signs or symptoms? °The main symptom of this condition is a lump behind the knee. This may be the only symptom of the condition. The lump may be painful, especially when the knee is straightened. If the lump is painful, the pain may come and go. The knee may also be stiff. °Symptoms may quickly get more severe if the cyst breaks open (ruptures). If the cyst ruptures, you may feel the following in your knee and calf: °· Sudden or worsening pain. °· Swelling. °· Bruising. °· Redness in the calf. °A Baker cyst does not always cause symptoms. °How is this diagnosed? °This condition may be diagnosed based on your symptoms and medical history. Your health care provider will also do a physical exam. This may include: °· Feeling the cyst to check whether it is tender. °· Checking your knee for signs of another knee condition that causes swelling. °You may have imaging tests, such as: °· X-rays. °· MRI. °· Ultrasound. °How is this treated? °A Baker cyst that is not painful may go away without treatment. If the cyst gets large or painful, it will likely get better if the underlying knee problem is treated. °If needed, treatment for a  Baker cyst may include: °· Resting. °· Keeping weight off of the knee. This means not leaning on the knee to support your body weight. °· Taking NSAIDs, such as ibuprofen, to reduce pain and swelling. °· Having a procedure to drain the fluid from the cyst with a needle (aspiration). You may also get an injection of a medicine that reduces swelling (steroid). °· Having surgery. This may be needed if other treatments do not work. This usually involves correcting knee damage and removing the cyst. °Follow these instructions at home: ° °Activity °· Rest as told by your health care provider. °· Avoid activities that make pain or swelling worse. °· Return to your normal activities as told by your health care provider. Ask your health care provider what activities are safe for you. °· Do not use the injured limb to support your body weight until your health care provider says that you can. Use crutches as told by your health care provider. °General instructions °· Take over-the-counter and prescription medicines only as told by your health care provider. °· Keep all follow-up visits as told by your health care provider. This is important. °Contact a health care provider if: °· You have knee pain, stiffness, or swelling that does not get better. °Get help right away if: °· You have sudden or worsening pain and swelling in your calf area. °Summary °· A Baker cyst, also called a popliteal cyst, is a growth that forms at the   back of the knee. °· In most cases, a Baker cyst results from another knee problem that causes swelling inside the knee. °· A Baker cyst that is not painful may go away without treatment. °· If needed, treatment for a Baker cyst may include resting, keeping weight off of the knee, medicines, or draining fluid from the cyst. °· Surgery may be needed if other treatments are not effective. °This information is not intended to replace advice given to you by your health care provider. Make sure you discuss any  questions you have with your health care provider. °Document Revised: 01/01/2019 Document Reviewed: 01/01/2019 °Elsevier Patient Education © 2020 [employer] Inc[employer] ° ° ° °

## 2020-03-18 ENCOUNTER — Emergency Department
Admission: EM | Admit: 2020-03-18 | Discharge: 2020-03-18 | Disposition: A | Discharge status: Home / Self Care | Payer: Self-pay | Attending: Emergency Medicine | Admitting: Emergency Medicine

## 2020-03-18 ENCOUNTER — Other Ambulatory Visit: Payer: Self-pay

## 2020-03-18 ENCOUNTER — Emergency Department: Payer: Self-pay

## 2020-03-18 DIAGNOSIS — Z79899 Other long term (current) drug therapy: Secondary | ICD-10-CM | POA: Insufficient documentation

## 2020-03-18 DIAGNOSIS — L02211 Cutaneous abscess of abdominal wall: Secondary | ICD-10-CM | POA: Insufficient documentation

## 2020-03-18 DIAGNOSIS — F17210 Nicotine dependence, cigarettes, uncomplicated: Secondary | ICD-10-CM | POA: Insufficient documentation

## 2020-03-18 LAB — CBC WITH DIFFERENTIAL/PLATELET
Abs Immature Granulocytes: 0.04 10*3/uL (ref 0.00–0.07)
Basophils Absolute: 0.1 10*3/uL (ref 0.0–0.1)
Basophils Relative: 1 %
Eosinophils Absolute: 0.7 10*3/uL — ABNORMAL HIGH (ref 0.0–0.5)
Eosinophils Relative: 6 %
HCT: 43.2 % (ref 36.0–46.0)
Hemoglobin: 13.4 g/dL (ref 12.0–15.0)
Immature Granulocytes: 0 %
Lymphocytes Relative: 23 %
Lymphs Abs: 2.4 10*3/uL (ref 0.7–4.0)
MCH: 25.6 pg — ABNORMAL LOW (ref 26.0–34.0)
MCHC: 31 g/dL (ref 30.0–36.0)
MCV: 82.6 fL (ref 80.0–100.0)
Monocytes Absolute: 0.7 10*3/uL (ref 0.1–1.0)
Monocytes Relative: 7 %
Neutro Abs: 6.8 10*3/uL (ref 1.7–7.7)
Neutrophils Relative %: 63 %
Platelets: 335 10*3/uL (ref 150–400)
RBC: 5.23 MIL/uL — ABNORMAL HIGH (ref 3.87–5.11)
RDW: 14.9 % (ref 11.5–15.5)
WBC: 10.7 10*3/uL — ABNORMAL HIGH (ref 4.0–10.5)
nRBC: 0 % (ref 0.0–0.2)

## 2020-03-18 LAB — COMPREHENSIVE METABOLIC PANEL
ALT: 46 U/L — ABNORMAL HIGH (ref 0–44)
AST: 27 U/L (ref 15–41)
Albumin: 3.6 g/dL (ref 3.5–5.0)
Alkaline Phosphatase: 92 U/L (ref 38–126)
Anion gap: 9 (ref 5–15)
BUN: 21 mg/dL — ABNORMAL HIGH (ref 6–20)
CO2: 23 mmol/L (ref 22–32)
Calcium: 8.8 mg/dL — ABNORMAL LOW (ref 8.9–10.3)
Chloride: 104 mmol/L (ref 98–111)
Creatinine, Ser: 0.73 mg/dL (ref 0.44–1.00)
GFR calc Af Amer: >60 mL/min (ref >60)
GFR calc non Af Amer: >60 mL/min (ref >60)
Glucose, Bld: 127 mg/dL — ABNORMAL HIGH (ref 70–99)
Potassium: 4.3 mmol/L (ref 3.5–5.1)
Sodium: 136 mmol/L (ref 135–145)
Total Bilirubin: 0.5 mg/dL (ref 0.3–1.2)
Total Protein: 7.3 g/dL (ref 6.5–8.1)

## 2020-03-18 LAB — LACTIC ACID, PLASMA: Lactic Acid, Venous: 1.4 mmol/L (ref 0.5–1.9)

## 2020-03-18 MED ORDER — NAPROXEN 500 MG PO TABS
500.0000 mg | ORAL_TABLET | Freq: Two times a day (BID) | ORAL | 0 refills | Status: DC
Start: 2020-03-18 — End: 2024-06-28

## 2020-03-18 MED ORDER — CEPHALEXIN 500 MG PO CAPS: 500.0000 mg | ORAL_CAPSULE | Freq: Three times a day (TID) | ORAL | 0 refills | Status: AC

## 2020-03-18 MED ORDER — OXYCODONE-ACETAMINOPHEN 5-325 MG PO TABS: 1.0000 | ORAL_TABLET | Freq: Four times a day (QID) | ORAL | 0 refills | Status: AC | PRN

## 2020-03-18 MED ORDER — CEPHALEXIN 500 MG PO CAPS
500.0000 mg | ORAL_CAPSULE | Freq: Once | ORAL | Status: AC
  Administered 2020-03-18: 500 mg via ORAL
  Filled 2020-03-18: qty 1

## 2020-03-18 MED ORDER — CLINDAMYCIN HCL 150 MG PO CAPS: 450.0000 mg | ORAL_CAPSULE | Freq: Three times a day (TID) | ORAL | 0 refills | Status: DC

## 2020-03-18 MED ORDER — CLINDAMYCIN HCL 150 MG PO CAPS
450.0000 mg | ORAL_CAPSULE | Freq: Once | ORAL | Status: AC
  Administered 2020-03-18: 450 mg via ORAL
  Filled 2020-03-18: qty 3

## 2020-03-18 NOTE — ED Provider Notes (Signed)
[facility] Emergency Department Provider Note  ____________________________________________  Time seen: Approximately 7:49 PM  I have reviewed the triage vital signs and the nursing notes.   HISTORY  Chief Complaint Abscess    HPI Diamond Mcclain is a 44 y.o. female with a history of GERD ovarian cyst who comes ED complaining of skin lesion on the right lower abdomen that started gradually 5 days ago.  Is been painful red and warm.  She is using a heating pad and taking NSAIDs to help with pain, but has been gradually worsening, now with some skin breakdown and purulent drainage from the wound today.  She also has cleaned it with peroxide.  Denies any puncture wounds or other trauma.  Denies any known insect or animal bites .  Does not frequently get skin infections.  Not diabetic or on any immunosuppressants..     Past Medical History:  Diagnosis Date  . GERD (gastroesophageal reflux disease)    H/O  . Ovarian cyst 1999  . PONV (postoperative nausea and vomiting)      Patient Active Problem List   Diagnosis Date Noted  . S/P rotator cuff repair 07/19/2015     Past Surgical History:  Procedure Laterality Date  . CHOLECYSTECTOMY    . KNEE SURGERY    . LYSIS OF ADHESION Left 06/04/2016   Procedure: LYSIS OF ADHESION;  Surgeon: Juanell FairlyKevin Krasinski, MD;  Location: [facility] ORS;  Service: Orthopedics;  Laterality: Left;  . OVARY SURGERY Left 1998  . SHOULDER ARTHROSCOPY WITH BICEPSTENOTOMY Left 06/04/2016   Procedure: SHOULDER ARTHROSCOPY WITH BICEPSTENOTOMY;  Surgeon: Juanell FairlyKevin Krasinski, MD;  Location: [facility] ORS;  Service: Orthopedics;  Laterality: Left;  . SHOULDER ARTHROSCOPY WITH OPEN ROTATOR CUFF REPAIR Left 07/19/2015   Procedure: SHOULDER ARTHROSCOPY WITH OPEN ROTATOR CUFF REPAIR;  Surgeon: Juanell FairlyKevin Krasinski, MD;  Location: [facility] ORS;  Service: Orthopedics;  Laterality: Left;  . SUBACROMIAL DECOMPRESSION Left 06/04/2016   Procedure: SUBACROMIAL DECOMPRESSION;   Surgeon: Juanell FairlyKevin Krasinski, MD;  Location: [facility] ORS;  Service: Orthopedics;  Laterality: Left;     Prior to Admission medications   Medication Sig Start Date End Date Taking? Authorizing Provider  cephALEXin (KEFLEX) 500 MG capsule Take 1 capsule (500 mg total) by mouth 3 (three) times daily for 10 days. 03/18/20 03/28/20  Sharman CheekStafford, Karlen Barbar, MD  clindamycin (CLEOCIN) 150 MG capsule Take 3 capsules (450 mg total) by mouth 3 (three) times daily. 03/18/20   Sharman CheekStafford, Kaliana Albino, MD  ibuprofen (ADVIL,MOTRIN) 800 MG tablet Take 1 tablet (800 mg total) by mouth every 8 (eight) hours as needed. 02/04/15   Beers, Charmayne Sheerharles M, PA-C  lidocaine (LIDODERM) 5 % Place 1 patch onto the skin daily. Remove & Discard patch within 12 hours or as directed by MD 10/07/16   Enid DerryWagner, Ashley, PA-C  naproxen (NAPROSYN) 500 MG tablet Take 1 tablet (500 mg total) by mouth 2 (two) times daily with a meal. 03/18/20   Sharman CheekStafford, Aiesha Leland, MD  ondansetron (ZOFRAN) 4 MG tablet Take 1 tablet (4 mg total) by mouth every 8 (eight) hours as needed for nausea or vomiting. 06/04/16   Juanell FairlyKrasinski, Kevin, MD  oxyCODONE (OXY IR/ROXICODONE) 5 MG immediate release tablet Take 1-2 tablets (5-10 mg total) by mouth every 4 (four) hours as needed for severe pain. 06/04/16   Juanell FairlyKrasinski, Kevin, MD  oxyCODONE-acetaminophen (PERCOCET) 5-325 MG tablet Take 1 tablet by mouth every 6 (six) hours as needed for severe pain. 03/18/20 03/18/21  Sharman CheekStafford, Jesseka Drinkard, MD  traMADol Janean Sark(ULTRAM) 50 MG tablet Take  1 tablet (50 mg total) by mouth every 6 (six) hours as needed. 09/29/16   Cuthriell, Delorise Royals, PA-C     Allergies Sulfa antibiotics and Tape   History reviewed. No pertinent family history.  Social History Social History   Tobacco Use  . Smoking status: Current Every Day Smoker    Packs/day: 0.50    Years: 15.00    Pack years: 7.50    Types: Cigarettes  . Smokeless tobacco: Never Used  Substance Use Topics  . Alcohol use: No  . Drug use: No    Review of  Systems  Constitutional:   No fever or chills.  ENT:   No sore throat. No rhinorrhea. Cardiovascular:   No chest pain or syncope. Respiratory:   No dyspnea or cough. Gastrointestinal:   Negative for abdominal pain, vomiting and diarrhea.  Musculoskeletal:   Negative for focal pain or swelling All other systems reviewed and are negative except as documented above in ROS and HPI.  ____________________________________________   PHYSICAL EXAM:  VITAL SIGNS: ED Triage Vitals  Enc Vitals Group     BP 03/18/20 1540 (!) 159/96     Pulse Rate 03/18/20 1540 78     Resp 03/18/20 1540 18     Temp 03/18/20 1540 99.5 F (37.5 C)     Temp Source 03/18/20 1540 Oral     SpO2 03/18/20 1540 95 %     Weight 03/18/20 1541 289 lb (131.1 kg)     Height 03/18/20 1541 5\' 2"  (1.575 m)     Head Circumference --      Peak Flow --      Pain Score 03/18/20 1541 7     Pain Loc --      Pain Edu? --      Excl. in GC? --     Vital signs reviewed, nursing assessments reviewed.   Constitutional:   Alert and oriented. Non-toxic appearance. Eyes:   Conjunctivae are normal. EOMI. PERRL. ENT      Head:   Normocephalic and atraumatic.      Nose:   Wearing a mask.      Mouth/Throat:   Wearing a mask.      Neck:   No meningismus. Full ROM. Hematological/Lymphatic/Immunilogical:   No cervical lymphadenopathy. Cardiovascular:   RRR.  Cap refill less than 2 seconds. Respiratory:   Normal respiratory effort without tachypnea/retractions. Gastrointestinal:   Soft and nontender. Non distended. There is no CVA tenderness.  No rebound, rigidity, or guarding.  Musculoskeletal:   Normal range of motion in all extremities. No joint effusions.  No lower extremity tenderness.  No edema. Neurologic:   Normal speech and language.  Motor grossly intact. No acute focal neurologic deficits are appreciated.  Skin:    Skin is warm, dry with an area of cellulitis on the right lower quadrant abdominal wall.  No crepitus.   There is a 2 cm central lesion with superficial skin necrosis and some scant purulent drainage around the periphery in the setting of a broader area of cellulitis spanning about 6 cm in diameter.  ____________________________________________    LABS (pertinent positives/negatives) (all labs ordered are listed, but only abnormal results are displayed) Labs Reviewed  COMPREHENSIVE METABOLIC PANEL - Abnormal; Notable for the following components:      Result Value   Glucose, Bld 127 (*)    BUN 21 (*)    Calcium 8.8 (*)    ALT 46 (*)    All other components within normal  limits  CBC WITH DIFFERENTIAL/PLATELET - Abnormal; Notable for the following components:   WBC 10.7 (*)    RBC 5.23 (*)    MCH 25.6 (*)    Eosinophils Absolute 0.7 (*)    All other components within normal limits  LACTIC ACID, PLASMA  URINALYSIS, COMPLETE (UACMP) WITH MICROSCOPIC  POC URINE PREG, ED   ____________________________________________   EKG    ____________________________________________    RADIOLOGY  DG Chest 2 View  Result Date: 03/18/2020 CLINICAL DATA:  Right lower abdominal abscess.  Smoker. EXAM: CHEST - 2 VIEW COMPARISON:  08/21/2014 FINDINGS: Normal sized heart. Clear lungs. Mild-to-moderate peribronchial thickening. Left shoulder fixation anchors. IMPRESSION: Mild to moderate bronchitic changes. Electronically Signed   By: Beckie Salts M.D.   On: 03/18/2020 16:34    ____________________________________________   PROCEDURES .Marland KitchenIncision and Drainage  Date/Time: 03/18/2020 7:51 PM Performed by: Sharman Cheek, MD Authorized by: Sharman Cheek, MD   Consent:    Consent obtained:  Verbal   Consent given by:  Patient   Risks discussed:  Bleeding, incomplete drainage and pain   Alternatives discussed:  No treatment Location:    Type:  Abscess   Size:  2cm   Location:  Trunk   Trunk location:  Abdomen Pre-procedure details:    Skin preparation:  Betadine Anesthesia (see MAR  for exact dosages):    Anesthesia method:  Local infiltration   Local anesthetic:  Lidocaine 1% w/o epi Procedure type:    Complexity:  Simple Procedure details:    Needle aspiration: no     Incision types:  Stab incision   Scalpel blade:  11   Wound management:  Probed and deloculated and irrigated with saline   Drainage:  Bloody and purulent   Drainage amount:  Scant   Wound treatment:  Wound left open   Packing materials:  None Post-procedure details:    Patient tolerance of procedure:  Tolerated well, no immediate complications    ____________________________________________    CLINICAL IMPRESSION / ASSESSMENT AND PLAN / ED COURSE  Medications ordered in the ED: Medications  clindamycin (CLEOCIN) capsule 450 mg (has no administration in time range)  cephALEXin (KEFLEX) capsule 500 mg (has no administration in time range)    Pertinent labs & imaging results that were available during my care of the patient were reviewed by me and considered in my medical decision making (see chart for details).  CARLOS QUACKENBUSH was evaluated in Emergency Department on 03/18/2020 for the symptoms described in the history of present illness. She was evaluated in the context of the global COVID-19 pandemic, which necessitated consideration that the patient might be at risk for infection with the SARS-CoV-2 virus that causes COVID-19. Institutional protocols and algorithms that pertain to the evaluation of patients at risk for COVID-19 are in a state of rapid change based on information released by regulatory bodies including the CDC and federal and state organizations. These policies and algorithms were followed during the patient's care in the ED.   Patient presents with cutaneous skin abscess on the abdominal wall and cellulitis.  Doubt deeper space infection, its not extensive, can palpate under the pannus and feel that it is not inflamed or fluctuant underneath.  Doubt necrotizing  fasciitis.  I&D completed with small amount of purulent drainage.  The cavity is not deep enough to pack.  Due to her Bactrim allergy I will prescribe Keflex and clindamycin.      ____________________________________________   FINAL CLINICAL IMPRESSION(S) / ED DIAGNOSES  Final diagnoses:  Abdominal wall abscess     ED Discharge Orders         Ordered    clindamycin (CLEOCIN) 150 MG capsule  3 times daily     Discontinue  Reprint     03/18/20 1949    cephALEXin (KEFLEX) 500 MG capsule  3 times daily     Discontinue  Reprint     03/18/20 1949    naproxen (NAPROSYN) 500 MG tablet  2 times daily with meals     Discontinue  Reprint     03/18/20 1949    oxyCODONE-acetaminophen (PERCOCET) 5-325 MG tablet  Every 6 hours PRN     Discontinue  Reprint     03/18/20 1949          Portions of this note were generated with dragon dictation software. Dictation errors may occur despite best attempts at proofreading.   Sharman Cheek, MD 03/18/20 [phone]

## 2020-03-18 NOTE — ED Triage Notes (Signed)
Pt comes POV with abscess that has since busted and appears infected. Dark, necrotic area in center with swelling and redness around it. Pt also endorses more fatigue than normal.

## 2020-03-18 NOTE — ED Notes (Signed)
Attempted IV without success.

## 2020-03-18 NOTE — ED Notes (Signed)
Pt c/o abscess that she noticed five days ago that opened up today. Dark open abscess noted on lower right abdomen, no discharge noted. Pt c/o malaise, denies N/V/D.

## 2023-07-29 ENCOUNTER — Other Ambulatory Visit: Payer: Self-pay

## 2023-07-29 ENCOUNTER — Emergency Department
Admission: EM | Admit: 2023-07-29 | Discharge: 2023-07-29 | Disposition: A | Discharge status: Home / Self Care | Payer: No Typology Code available for payment source | Attending: Emergency Medicine | Admitting: Emergency Medicine

## 2023-07-29 ENCOUNTER — Emergency Department: Payer: No Typology Code available for payment source

## 2023-07-29 DIAGNOSIS — Y92410 Unspecified street and highway as the place of occurrence of the external cause: Secondary | ICD-10-CM | POA: Diagnosis not present

## 2023-07-29 DIAGNOSIS — M545 Low back pain, unspecified: Secondary | ICD-10-CM | POA: Diagnosis not present

## 2023-07-29 DIAGNOSIS — M25532 Pain in left wrist: Secondary | ICD-10-CM | POA: Diagnosis present

## 2023-07-29 MED ORDER — CYCLOBENZAPRINE HCL 10 MG PO TABS: 10.0000 mg | ORAL_TABLET | Freq: Three times a day (TID) | ORAL | 0 refills | Status: DC | PRN

## 2023-07-29 MED ORDER — HYDROCODONE-ACETAMINOPHEN 5-325 MG PO TABS
1.0000 | ORAL_TABLET | Freq: Once | ORAL | Status: AC
  Administered 2023-07-29: 1 via ORAL
  Filled 2023-07-29: qty 1
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
## 2023-08-06 DIAGNOSIS — M5412 Radiculopathy, cervical region: Secondary | ICD-10-CM | POA: Insufficient documentation

## 2023-09-24 ENCOUNTER — Other Ambulatory Visit: Payer: Self-pay | Admitting: Orthopedic Surgery

## 2023-09-24 DIAGNOSIS — M5412 Radiculopathy, cervical region: Secondary | ICD-10-CM

## 2023-09-30 ENCOUNTER — Ambulatory Visit
Admission: RE | Admit: 2023-09-30 | Discharge: 2023-09-30 | Disposition: A | Discharge status: Home / Self Care | Payer: Self-pay | Source: Ambulatory Visit | Attending: Orthopedic Surgery | Admitting: Orthopedic Surgery

## 2023-09-30 DIAGNOSIS — M5412 Radiculopathy, cervical region: Secondary | ICD-10-CM | POA: Insufficient documentation

## 2023-12-15 DIAGNOSIS — M7542 Impingement syndrome of left shoulder: Secondary | ICD-10-CM | POA: Insufficient documentation

## 2024-02-20 DIAGNOSIS — M47816 Spondylosis without myelopathy or radiculopathy, lumbar region: Secondary | ICD-10-CM | POA: Insufficient documentation

## 2024-03-29 DIAGNOSIS — M47812 Spondylosis without myelopathy or radiculopathy, cervical region: Secondary | ICD-10-CM | POA: Insufficient documentation

## 2024-05-18 DIAGNOSIS — M542 Cervicalgia: Secondary | ICD-10-CM | POA: Insufficient documentation

## 2024-06-21 DIAGNOSIS — G549 Nerve root and plexus disorder, unspecified: Secondary | ICD-10-CM | POA: Insufficient documentation

## 2024-06-28 ENCOUNTER — Ambulatory Visit (INDEPENDENT_AMBULATORY_CARE_PROVIDER_SITE_OTHER): Admitting: Family Medicine

## 2024-06-28 ENCOUNTER — Encounter: Payer: Self-pay | Admitting: Family Medicine

## 2024-06-28 VITALS — BP 158/95 | HR 78 | Resp 16 | Ht 62.0 in | Wt 289.0 lb

## 2024-06-28 DIAGNOSIS — Z7689 Persons encountering health services in other specified circumstances: Secondary | ICD-10-CM

## 2024-06-28 DIAGNOSIS — F17201 Nicotine dependence, unspecified, in remission: Secondary | ICD-10-CM

## 2024-06-28 DIAGNOSIS — Z0000 Encounter for general adult medical examination without abnormal findings: Secondary | ICD-10-CM

## 2024-06-28 DIAGNOSIS — M5412 Radiculopathy, cervical region: Secondary | ICD-10-CM

## 2024-06-28 DIAGNOSIS — R030 Elevated blood-pressure reading, without diagnosis of hypertension: Secondary | ICD-10-CM

## 2024-06-28 DIAGNOSIS — M1712 Unilateral primary osteoarthritis, left knee: Secondary | ICD-10-CM | POA: Diagnosis not present

## 2024-06-28 DIAGNOSIS — M47812 Spondylosis without myelopathy or radiculopathy, cervical region: Secondary | ICD-10-CM | POA: Diagnosis not present
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
## 2024-06-28 NOTE — Patient Instructions (Signed)

## 2024-06-29 LAB — CBC WITH DIFFERENTIAL/PLATELET
Basophils Absolute: 0.1 x10E3/uL (ref 0.0–0.2)
Basos: 1 %
EOS (ABSOLUTE): 0.7 x10E3/uL — ABNORMAL HIGH (ref 0.0–0.4)
Eos: 7 %
Hematocrit: 47.8 % — ABNORMAL HIGH (ref 34.0–46.6)
Hemoglobin: 15.4 g/dL (ref 11.1–15.9)
Immature Grans (Abs): 0 x10E3/uL (ref 0.0–0.1)
Immature Granulocytes: 0 %
Lymphocytes Absolute: 2.7 x10E3/uL (ref 0.7–3.1)
Lymphs: 28 %
MCH: 28.8 pg (ref 26.6–33.0)
MCHC: 32.2 g/dL (ref 31.5–35.7)
MCV: 89 fL (ref 79–97)
Monocytes Absolute: 0.7 x10E3/uL (ref 0.1–0.9)
Monocytes: 7 %
Neutrophils Absolute: 5.7 x10E3/uL (ref 1.4–7.0)
Neutrophils: 57 %
Platelets: 237 x10E3/uL (ref 150–450)
RBC: 5.35 x10E6/uL — ABNORMAL HIGH (ref 3.77–5.28)
RDW: 13 % (ref 11.7–15.4)
WBC: 9.9 x10E3/uL (ref 3.4–10.8)

## 2024-06-29 LAB — COMPREHENSIVE METABOLIC PANEL WITH GFR
ALT: 52 IU/L — ABNORMAL HIGH (ref 0–32)
AST: 40 IU/L (ref 0–40)
Albumin: 3.8 g/dL — ABNORMAL LOW (ref 3.9–4.9)
Alkaline Phosphatase: 126 IU/L — ABNORMAL HIGH (ref 41–116)
BUN/Creatinine Ratio: 13 (ref 9–23)
BUN: 10 mg/dL (ref 6–24)
Bilirubin Total: 0.6 mg/dL (ref 0.0–1.2)
CO2: 23 mmol/L (ref 20–29)
Calcium: 8.9 mg/dL (ref 8.7–10.2)
Chloride: 101 mmol/L (ref 96–106)
Creatinine, Ser: 0.76 mg/dL (ref 0.57–1.00)
Globulin, Total: 3.1 g/dL (ref 1.5–4.5)
Glucose: 149 mg/dL — ABNORMAL HIGH (ref 70–99)
Potassium: 5.2 mmol/L (ref 3.5–5.2)
Sodium: 136 mmol/L (ref 134–144)
Total Protein: 6.9 g/dL (ref 6.0–8.5)
eGFR: 97 mL/min/1.73 (ref >59)

## 2024-06-29 LAB — LIPID PANEL
Chol/HDL Ratio: 3.9 ratio (ref 0.0–4.4)
Cholesterol, Total: 194 mg/dL (ref 100–199)
HDL: 50 mg/dL (ref >39)
LDL Chol Calc (NIH): 122 mg/dL — ABNORMAL HIGH (ref 0–99)
Triglycerides: 124 mg/dL (ref 0–149)
VLDL Cholesterol Cal: 22 mg/dL (ref 5–40)

## 2024-06-29 LAB — HEMOGLOBIN A1C
Est. average glucose Bld gHb Est-mCnc: 252 mg/dL
Hgb A1c MFr Bld: 10.4 % — ABNORMAL HIGH (ref 4.8–5.6)

## 2024-06-29 LAB — TSH RFX ON ABNORMAL TO FREE T4: TSH: 3.53 u[IU]/mL (ref 0.450–4.500)

## 2024-06-29 LAB — VITAMIN D 25 HYDROXY (VIT D DEFICIENCY, FRACTURES): Vit D, 25-Hydroxy: 11.4 ng/mL — ABNORMAL LOW (ref 30.0–100.0)

## 2024-06-30 ENCOUNTER — Other Ambulatory Visit: Payer: Self-pay | Admitting: Family Medicine

## 2024-06-30 ENCOUNTER — Encounter: Payer: Self-pay | Admitting: Family Medicine

## 2024-06-30 ENCOUNTER — Telehealth: Payer: Self-pay | Admitting: Family Medicine

## 2024-06-30 DIAGNOSIS — E559 Vitamin D deficiency, unspecified: Secondary | ICD-10-CM

## 2024-06-30 DIAGNOSIS — E1165 Type 2 diabetes mellitus with hyperglycemia: Secondary | ICD-10-CM

## 2024-06-30 MED ORDER — VITAMIN D (ERGOCALCIFEROL) 1.25 MG (50000 UNIT) PO CAPS: 50000.0000 [IU] | ORAL_CAPSULE | ORAL | 0 refills | Status: DC

## 2024-06-30 MED ORDER — METFORMIN HCL ER 500 MG PO TB24: 500.0000 mg | ORAL_TABLET | Freq: Every day | ORAL | 2 refills | Status: DC

## 2024-06-30 NOTE — Telephone Encounter (Signed)
 Spoke with patient, verifying with two identifiers, to review recent lab work.  Discussed side effects of vitamin D deficiency and will prescribe high-dose vitamin D 50,000 units once weekly for 8 weeks.  Advised patient that we will repeat her level once she completes this medication regimen.  Discussed concerns of elevated LDL and recent diagnosis of type 2 diabetes.  Advised patient of macrovascular and microvascular risks associated with uncontrolled blood sugars.  Advised patient best way to control A1cover 10% is with nightly insulin or GLP-1 agonist.  Patient reports she is not injecting herself, due to her past history of drug abuse.  Discussed other methods of controlling blood sugar.  Will start at low-dose metformin 500 mg extended release with breakfast.  Advised patient about increasing the dose as tolerated to help better control her blood sugar.  Will follow-up with patient at scheduled follow-up appointment.  Evalene Arts, FNP-C [facility] at [facility]  [address] [place], [place] [address] [phone]

## 2024-07-19 ENCOUNTER — Ambulatory Visit (INDEPENDENT_AMBULATORY_CARE_PROVIDER_SITE_OTHER): Admitting: Family Medicine

## 2024-07-19 ENCOUNTER — Encounter: Payer: Self-pay | Admitting: Family Medicine

## 2024-07-19 VITALS — BP 132/76 | HR 77 | Wt 294.2 lb

## 2024-07-19 DIAGNOSIS — E1165 Type 2 diabetes mellitus with hyperglycemia: Secondary | ICD-10-CM

## 2024-07-19 DIAGNOSIS — R030 Elevated blood-pressure reading, without diagnosis of hypertension: Secondary | ICD-10-CM | POA: Diagnosis not present

## 2024-07-19 MED ORDER — LANCETS MISC: 1.0000 | 0 refills | Status: AC

## 2024-07-19 MED ORDER — BLOOD GLUCOSE MONITORING SUPPL DEVI: 1.0000 | 0 refills | Status: AC

## 2024-07-19 MED ORDER — BLOOD GLUCOSE TEST VI STRP: 1.0000 | ORAL_STRIP | 0 refills | Status: AC

## 2024-07-19 MED ORDER — METFORMIN HCL 1000 MG PO TABS: 1000.0000 mg | ORAL_TABLET | Freq: Every day | ORAL | 3 refills | Status: AC

## 2024-07-19 MED ORDER — LANCET DEVICE MISC: 1.0000 | 0 refills | Status: AC

## 2024-07-19 NOTE — Progress Notes (Signed)
 Established Patient Office Visit  Subjective  Patient ID: Diamond Mcclain, female    DOB: 1976/01/07  Age: 48 y.o. MRN: [id number]  CC: Elevated Blood Pressure   Discussed the use of AI scribe software for clinical note transcription with the patient, who gave verbal consent to proceed.  History of Present Illness    Diamond Mcclain is a 48 year old female with hypertension and diabetes who presents for blood pressure management.  She has been experiencing variable blood pressure readings, typically around 150-160 mmHg, with higher values noted in the evenings. She monitors her blood pressure at home but did not bring the log to the appointment. She reports that her pain increases with activity and that her blood pressure tends to be higher in the evenings. Denies headache, dizziness, CP, SHOB, vision changes.    She is currently taking metformin for diabetes management and tolerates it well, although she experiences increased hunger in the mornings. She takes the medication at night, as advised by her sister. She does not have a blood sugar monitor at home and is hesitant about finger pricking. She avoids eating after 7 PM to prevent vivid dreams and to manage her early morning schedule, which starts as early as 3-4 AM due to work commitments.  She has not recently consulted with her orthopedist but mentions that further interventions at the pain clinic might exacerbate her condition. She continues to manage her pain independently.      BP Readings from Last 3 Encounters:  07/19/24 132/76  06/28/24 (!) 158/95  07/29/23 (!) 135/98   ROS: see HPI    Objective:    BP 132/76   Pulse 77   Wt 294 lb 3.2 oz (133.4 kg)   SpO2 95%   BMI 53.81 kg/m  BP Readings from Last 3 Encounters:  07/19/24 132/76  06/28/24 (!) 158/95  07/29/23 (!) 135/98    Physical Exam Vitals reviewed.  Constitutional:      Appearance: Normal appearance.  Cardiovascular:     Rate and Rhythm: Normal  rate and regular rhythm.     Pulses: Normal pulses.     Heart sounds: Normal heart sounds.  Pulmonary:     Effort: Pulmonary effort is normal.     Breath sounds: Normal breath sounds.  Neurological:     Mental Status: She is alert.  Psychiatric:        Mood and Affect: Mood normal.        Behavior: Behavior normal.        Thought Content: Thought content normal.        Judgment: Judgment normal.     Assessment & Plan:   1. Elevated blood pressure reading in office without diagnosis of hypertension (Primary) Patient presents today with decent blood pressure. Patient in no acute distress and is well-appearing. Denies chest pain, shortness of breath, lower extremity edema, vision changes, headaches. Cardiovascular exam with heart regular rate and rhythm. Normal heart sounds, no murmurs present. No lower extremity edema present. Lungs clear to auscultation bilaterally. Patient is not currently taking antihypertensives. Advised patient to closely monitor blood pressure and return to office sooner if blood pressure begins to increase greater than 130/80.   2. Type 2 diabetes mellitus with hyperglycemia, without long-term current use of insulin (HCC) Increased hunger possibly due to elevated blood sugar. Tolerating metformin well. No home blood sugar monitor. Increased metformin to 1000 mg once daily. Recommended obtaining a blood sugar meter for blood glucose monitoring. Rx  sent to pharmacy file.  - metFORMIN (GLUCOPHAGE) 1000 MG tablet; Take 1 tablet (1,000 mg total) by mouth daily.  Dispense: 90 tablet; Refill: 3 - Blood Glucose Monitoring Suppl DEVI; 1 each by Does not apply route as directed. Dispense based on patient and insurance preference. Use up to four times daily as directed. (FOR ICD-10 E10.9, E11.9).  Dispense: 1 each; Refill: 0 - Glucose Blood (BLOOD GLUCOSE TEST STRIPS) STRP; 1 each by Does not apply route as directed. Dispense based on patient and insurance preference. Use up to  four times daily as directed. (FOR ICD-10 E10.9, E11.9).  Dispense: 100 strip; Refill: 0 - Lancet Device MISC; 1 each by Does not apply route as directed. Dispense based on patient and insurance preference. Use up to four times daily as directed. (FOR ICD-10 E10.9, E11.9).  Dispense: 1 each; Refill: 0 - Lancets MISC; 1 each by Does not apply route as directed. Dispense based on patient and insurance preference. Use up to four times daily as directed. (FOR ICD-10 E10.9, E11.9).  Dispense: 100 each; Refill: 0   Return in about 2 months (around 09/29/2024) for Diabetes f/u.    Evalene Arts, FNP

## 2024-08-24 ENCOUNTER — Other Ambulatory Visit: Payer: Self-pay | Admitting: Family Medicine

## 2024-08-24 DIAGNOSIS — E559 Vitamin D deficiency, unspecified: Secondary | ICD-10-CM

## 2024-09-17 ENCOUNTER — Ambulatory Visit (INDEPENDENT_AMBULATORY_CARE_PROVIDER_SITE_OTHER): Admitting: Family Medicine

## 2024-09-17 VITALS — BP 158/82 | HR 69 | Temp 97.9°F | Resp 16 | Wt 293.1 lb

## 2024-09-17 DIAGNOSIS — E1165 Type 2 diabetes mellitus with hyperglycemia: Secondary | ICD-10-CM | POA: Diagnosis not present

## 2024-09-17 DIAGNOSIS — I152 Hypertension secondary to endocrine disorders: Secondary | ICD-10-CM

## 2024-09-17 DIAGNOSIS — E559 Vitamin D deficiency, unspecified: Secondary | ICD-10-CM

## 2024-09-17 MED ORDER — DAPAGLIFLOZIN PROPANEDIOL 5 MG PO TABS: 5.0000 mg | ORAL_TABLET | Freq: Every day | ORAL | 3 refills | Status: AC

## 2024-09-17 MED ORDER — LISINOPRIL 5 MG PO TABS: 5.0000 mg | ORAL_TABLET | Freq: Every day | ORAL | 2 refills | Status: AC

## 2024-09-17 NOTE — Patient Instructions (Signed)

## 2024-09-17 NOTE — Progress Notes (Signed)
 "  Established Patient Office Visit  Subjective  Patient ID: Diamond Mcclain, female    DOB: [birth year]  Age: 49 y.o. MRN: [id number]  Chief Complaint  Patient presents with   Diabetes   Follow-up    Pt. Here for a follow-up visit for diabetes.    Discussed the use of AI scribe software for clinical note transcription with the patient, who gave verbal consent to proceed.  History of Present Illness   Diamond Mcclain is a 49 year old female with type 2 diabetes who presents for diabetes follow-up.   She has experienced significant gastrointestinal distress since increasing her metformin  dose to 1000 mg, including diarrhea and a metallic taste and smell, which she attributes to the medication. She initially tolerated 500 mg of metformin  without issues.  She had a stomach bug after her nephews and sister visited for Christmas, which included vomiting and diarrhea lasting about a week. Although the vomiting has resolved, she continues to have gastrointestinal discomfort, which she believes is due to the metformin .  She has been unable to check her blood sugars at home due to difficulty with the process. Her blood pressure has been elevated, with readings around 140-160 mmHg, particularly at night after working all day. She checks her blood pressure at [facility] or [facility]. She experiences a low-grade headache today.   She describes chronic pain, stating she wakes up and goes to bed in pain, with some days being worse than others. She rates her pain as a 7 out of 10 today and manages it with ibuprofen . She has not been in contact with her neurosurgeon recently, as she wants to address her current issues first. She mentions having medication from the neurosurgeon but does not specify what it is.     ROS: see HPI    Objective:    BP (!) 158/82   Pulse 69   Temp 97.9 F (36.6 C) (Oral)   Resp 16   Wt 293 lb 1.6 oz (132.9 kg)   SpO2 96%   BMI 53.61 kg/m  BP Readings from Last 3 Encounters:   09/17/24 (!) 158/82  07/19/24 132/76  06/28/24 (!) 158/95    Physical Exam Vitals reviewed.  Constitutional:      Appearance: Normal appearance.  Cardiovascular:     Rate and Rhythm: Normal rate and regular rhythm.     Pulses: Normal pulses.     Heart sounds: Normal heart sounds.  Pulmonary:     Effort: Pulmonary effort is normal.     Breath sounds: Normal breath sounds.  Neurological:     Mental Status: She is alert.  Psychiatric:        Mood and Affect: Mood normal.        Behavior: Behavior normal.     Assessment & Plan:   1. Type 2 diabetes mellitus with hyperglycemia, without long-term current use of insulin (HCC) (Primary) Intolerant to metformin  due to gastrointestinal side effects. Farxiga  considered for blood sugar control and renal protection. Insurance coverage anticipated. Last hemoglobin A1c done on 06/28/2024 with abnormal level of 10.4%. Patient prefers to avoid insulin injections. Scheduled lab-only visit to repeat hemoglobin A1c and vitamin D  level after January 27th. Discontinued metformin  due to adverse side effects. Initiated Farxiga  5mg  daily and discussed adverse side effects. Follow up after completing blood work.  - Hemoglobin A1c; Future - Vitamin B12; Future - Urine microalbumin-creatinine with uACR - dapagliflozin  propanediol (FARXIGA ) 5 MG TABS tablet; Take 1 tablet (5 mg total)  by mouth daily.  Dispense: 90 tablet; Refill: 3  2. Vitamin D  deficiency Vitamin D  levels require reassessment. Completed high dose vitamin D  medication regimne. Repeat vitamin D  with other labs.    - VITAMIN D  25 Hydroxy (Vit-D Deficiency, Fractures); Future  3. Hypertension due to endocrine disorder Elevated blood pressure readings at home and in office. High pain levels may contribute. Patient presents today with slightly elevated blood pressure. Patient in no acute distress and is well-appearing. Denies chest pain, shortness of breath, lower extremity edema, vision  changes, headaches. Cardiovascular exam with heart regular rate and rhythm. Normal heart sounds, no murmurs present. No lower extremity edema present. Lungs clear to auscultation bilaterally. Lisinopril  considered for blood pressure management and renal protection. Patient agreeable. Initiated low-dose lisinopril  at 5mg  daily.  Advised patient to monitor blood pressure occasionally to determine effectiveness of low dose medication. Follow-up in 4 weeks for newly-diagnosed HTN.  - Basic Metabolic Panel (BMET); Future     Return in about 12 days (around 09/29/2024) for LAB ONLY VISIT  & 4 week f/u for HTN .    Evalene Arts, FNP "

## 2024-09-20 LAB — MICROALBUMIN / CREATININE URINE RATIO
Creatinine, Urine: 140 mg/dL
Microalb/Creat Ratio: 7 mg/g{creat} (ref 0–29)
Microalbumin, Urine: 9.2 ug/mL

## 2024-09-22 ENCOUNTER — Ambulatory Visit: Payer: Self-pay | Admitting: Family Medicine

## 2024-09-22 DIAGNOSIS — E559 Vitamin D deficiency, unspecified: Secondary | ICD-10-CM

## 2024-09-29 ENCOUNTER — Ambulatory Visit

## 2024-09-29 DIAGNOSIS — E1165 Type 2 diabetes mellitus with hyperglycemia: Secondary | ICD-10-CM

## 2024-09-29 DIAGNOSIS — E559 Vitamin D deficiency, unspecified: Secondary | ICD-10-CM

## 2024-09-29 DIAGNOSIS — I152 Hypertension secondary to endocrine disorders: Secondary | ICD-10-CM

## 2024-09-30 LAB — BASIC METABOLIC PANEL WITH GFR
BUN/Creatinine Ratio: 17 (ref 9–23)
BUN: 12 mg/dL (ref 6–24)
CO2: 24 mmol/L (ref 20–29)
Calcium: 9.3 mg/dL (ref 8.7–10.2)
Chloride: 99 mmol/L (ref 96–106)
Creatinine, Ser: 0.72 mg/dL (ref 0.57–1.00)
Glucose: 212 mg/dL — ABNORMAL HIGH (ref 70–99)
Potassium: 5 mmol/L (ref 3.5–5.2)
Sodium: 135 mmol/L (ref 134–144)
eGFR: 104 mL/min/{1.73_m2}

## 2024-09-30 LAB — HEMOGLOBIN A1C
Est. average glucose Bld gHb Est-mCnc: 220 mg/dL
Hgb A1c MFr Bld: 9.3 % — ABNORMAL HIGH (ref 4.8–5.6)

## 2024-09-30 LAB — VITAMIN D 25 HYDROXY (VIT D DEFICIENCY, FRACTURES): Vit D, 25-Hydroxy: 10.6 ng/mL — ABNORMAL LOW (ref 30.0–100.0)

## 2024-09-30 LAB — VITAMIN B12: Vitamin B-12: 875 pg/mL (ref 232–1245)

## 2024-09-30 MED ORDER — VITAMIN D (ERGOCALCIFEROL) 1.25 MG (50000 UNIT) PO CAPS: 50000.0000 [IU] | ORAL_CAPSULE | ORAL | 0 refills | Status: AC

## 2024-10-18 ENCOUNTER — Ambulatory Visit: Admitting: Family Medicine
